# Patient Record
Sex: Female | Born: 1940
Health system: Southern US, Community
[De-identification: ages and names within clinical notes are randomized; demographics above are authoritative.]

## PROBLEM LIST (undated history)

## (undated) DIAGNOSIS — N6320 Unspecified lump in the left breast, unspecified quadrant: Secondary | ICD-10-CM

## (undated) DIAGNOSIS — K259 Gastric ulcer, unspecified as acute or chronic, without hemorrhage or perforation: Secondary | ICD-10-CM

## (undated) HISTORY — PX: TUBAL LIGATION: SHX77

## (undated) HISTORY — PX: CHOLECYSTECTOMY: SHX55

## (undated) HISTORY — PX: AUGMENTATION MAMMAPLASTY: SUR837

## (undated) HISTORY — DX: Unspecified lump in the left breast, unspecified quadrant: N63.20

## (undated) HISTORY — PX: OTHER SURGICAL HISTORY: SHX169

---

## 2003-11-08 ENCOUNTER — Emergency Department (HOSPITAL_COMMUNITY): Admission: EM | Admit: 2003-11-08 | Discharge: 2003-11-08 | Payer: Self-pay | Admitting: Emergency Medicine

## 2004-09-16 ENCOUNTER — Ambulatory Visit (HOSPITAL_COMMUNITY): Admission: RE | Admit: 2004-09-16 | Discharge: 2004-09-16 | Payer: Self-pay | Admitting: Obstetrics and Gynecology

## 2004-10-06 ENCOUNTER — Ambulatory Visit (HOSPITAL_COMMUNITY): Admission: RE | Admit: 2004-10-06 | Discharge: 2004-10-06 | Payer: Self-pay | Admitting: Obstetrics and Gynecology

## 2005-05-05 ENCOUNTER — Ambulatory Visit: Payer: Self-pay | Admitting: Orthopedic Surgery

## 2005-06-30 ENCOUNTER — Ambulatory Visit: Payer: Self-pay | Admitting: Orthopedic Surgery

## 2005-07-19 ENCOUNTER — Ambulatory Visit: Payer: Self-pay | Admitting: Orthopedic Surgery

## 2005-07-19 ENCOUNTER — Ambulatory Visit (HOSPITAL_COMMUNITY): Admission: RE | Admit: 2005-07-19 | Discharge: 2005-07-19 | Payer: Self-pay | Admitting: Orthopedic Surgery

## 2005-07-21 ENCOUNTER — Ambulatory Visit: Payer: Self-pay | Admitting: Orthopedic Surgery

## 2005-07-28 ENCOUNTER — Ambulatory Visit: Payer: Self-pay | Admitting: Orthopedic Surgery

## 2005-08-10 ENCOUNTER — Ambulatory Visit: Payer: Self-pay | Admitting: Orthopedic Surgery

## 2005-08-25 ENCOUNTER — Ambulatory Visit: Payer: Self-pay | Admitting: Orthopedic Surgery

## 2005-09-08 ENCOUNTER — Ambulatory Visit: Payer: Self-pay | Admitting: Orthopedic Surgery

## 2006-01-18 ENCOUNTER — Ambulatory Visit (HOSPITAL_COMMUNITY): Admission: RE | Admit: 2006-01-18 | Discharge: 2006-01-18 | Payer: Self-pay | Admitting: Obstetrics and Gynecology

## 2006-02-27 IMAGING — US US BREAST*L*
1 series · 6 of 6 positions shown · non-contrast
Comparison: none

UNILATERAL LEFT  DIAGNOSTIC MAMMOGRAM AND LEFT BREAST ULTRASOUND:

CLINICAL:  The patient returns after screening study on 09-16-04.
Spot compression views are performed of  the lower inner quadrant of the left breast, showing a
persistent small nodule in the medial aspect of the left breast.  No discrete mass is identified
along the inferior aspect of the left breast on spot compression views.
On physical exam, I am unable to palpate an abnormality in the medial portion of the left breast.
Ultrasound is performed, showing an area of probable apocrine metaplasia in the 9 o'clock position
10 cm from the nipple.  This correlates well with the nodule seen mammographically in the medial
aspect of the left breast.  Ultrasound is also performed of the entire lower portion of the left
breast, showing normal-appearing fibroglandular parenchyma with scattered ectatic ducts.  No masses
identified in this location.

[Series 1: unknown · 0.06mm/px · 6 of 6 slices shown]
[im 1/6]
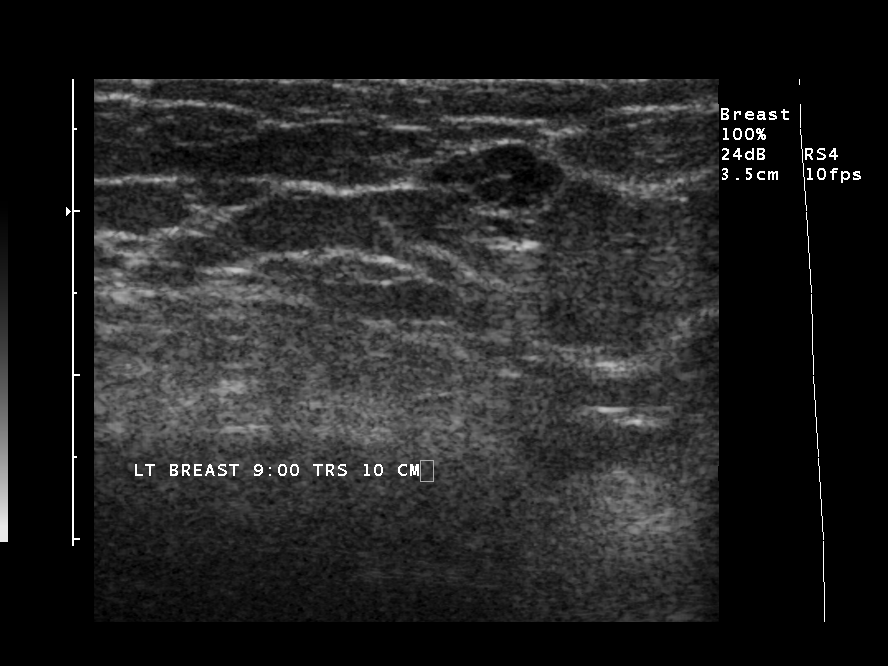
[im 2/6]
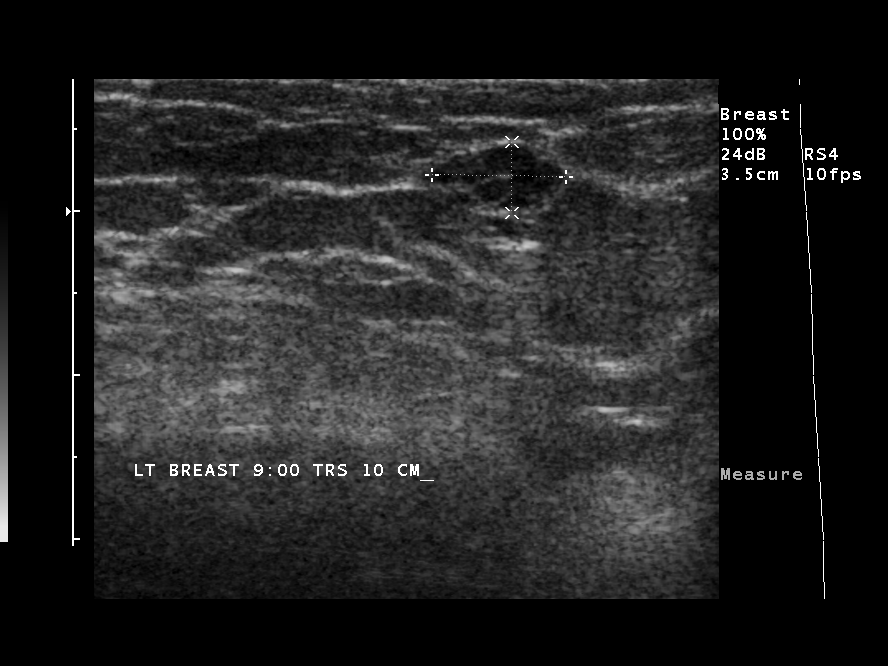
[im 3/6]
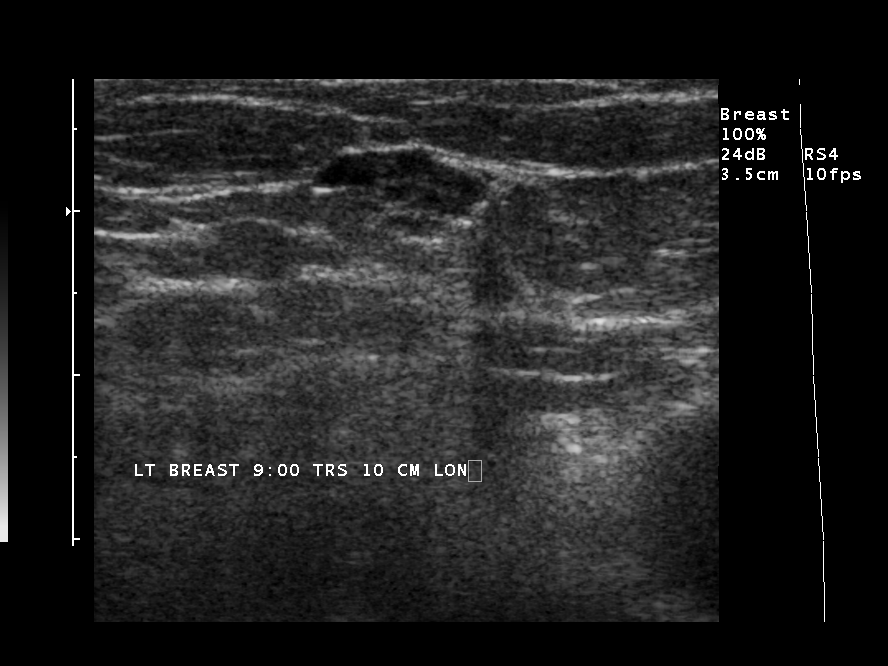
[im 4/6]
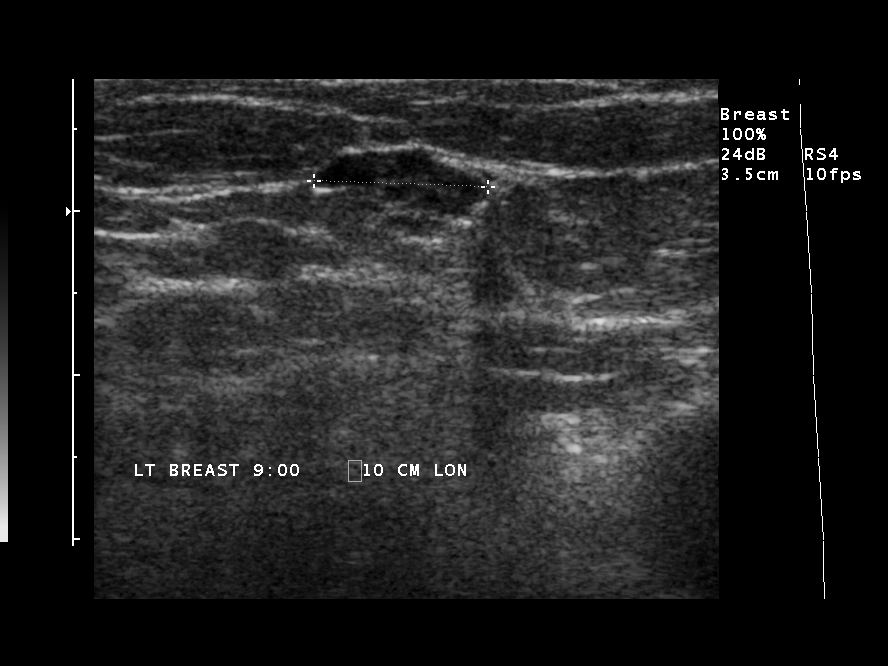
[im 5/6]
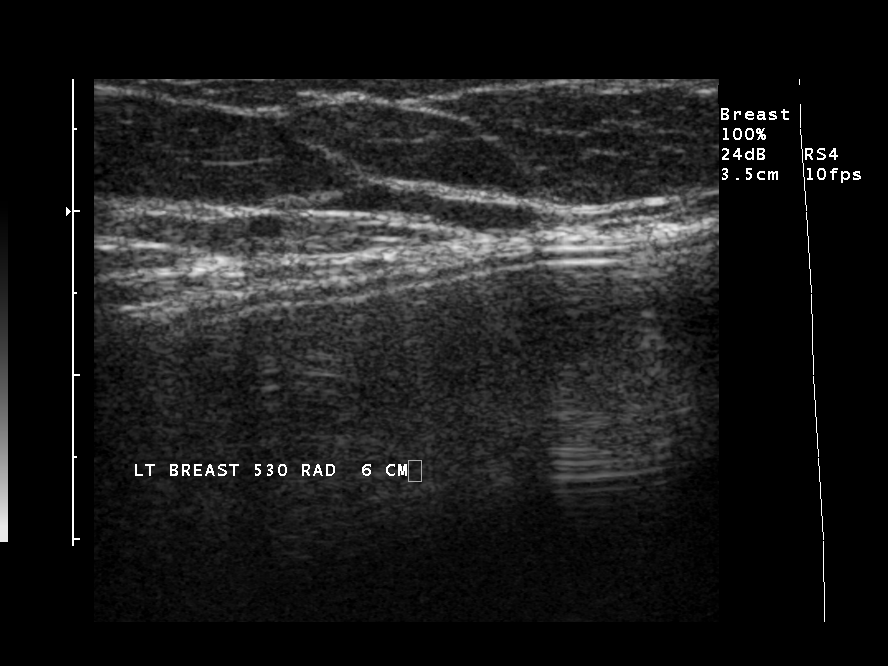
[im 6/6]
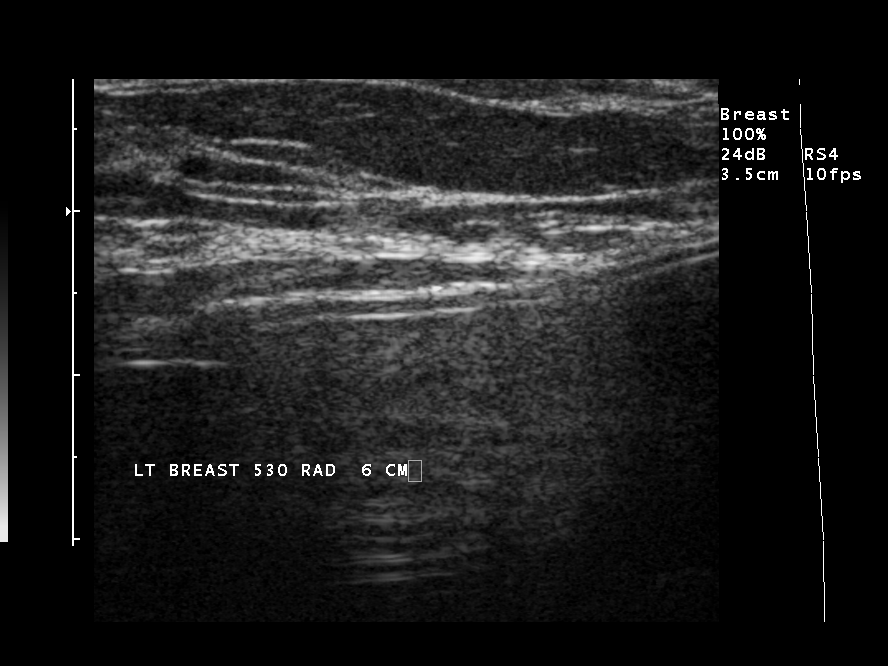

[6 of 6 positions shown; findings below may reference images not displayed]

IMPRESSION: Persistent abnormality in the medial portion of the left breast corresponds to an area of probable
apocrine metaplasia.  I would suggest a follow-up left breast ultrasound in six months.  I will
also compare with the prior films from [REDACTED] when they become available and an addendum will be made
regarding further recommendations.

Comparison is made to previous study from [REDACTED] dated 02/05/96. Recently noted nodule in the medial
aspect of the left breast is not clearly seen on prior study.  No other abnormality is noted on
prior exam.  I would concur with the suggestion for 6 month followup left breast ultrasound given
the probably benign sonographic findings.

ASSESSMENT: Probably benign - BI-RADS 3

Ultrasound of the left breast in 6 months.

## 2006-11-08 ENCOUNTER — Ambulatory Visit (HOSPITAL_COMMUNITY): Admission: RE | Admit: 2006-11-08 | Discharge: 2006-11-08 | Payer: Self-pay | Admitting: Internal Medicine

## 2006-11-08 ENCOUNTER — Ambulatory Visit: Payer: Self-pay | Admitting: Internal Medicine

## 2007-08-08 ENCOUNTER — Ambulatory Visit: Payer: Self-pay | Admitting: Gastroenterology

## 2007-08-22 ENCOUNTER — Ambulatory Visit (HOSPITAL_COMMUNITY): Admission: RE | Admit: 2007-08-22 | Discharge: 2007-08-22 | Payer: Self-pay | Admitting: Internal Medicine

## 2007-08-22 ENCOUNTER — Encounter: Payer: Self-pay | Admitting: Internal Medicine

## 2007-08-22 ENCOUNTER — Ambulatory Visit: Payer: Self-pay | Admitting: Internal Medicine

## 2008-04-09 ENCOUNTER — Other Ambulatory Visit: Admission: RE | Admit: 2008-04-09 | Discharge: 2008-04-09 | Payer: Self-pay | Admitting: Obstetrics and Gynecology

## 2008-11-13 ENCOUNTER — Encounter: Payer: Self-pay | Admitting: Orthopedic Surgery

## 2008-11-20 ENCOUNTER — Ambulatory Visit: Payer: Self-pay | Admitting: Orthopedic Surgery

## 2008-11-20 DIAGNOSIS — M79609 Pain in unspecified limb: Secondary | ICD-10-CM | POA: Insufficient documentation

## 2009-04-27 ENCOUNTER — Ambulatory Visit: Payer: Self-pay | Admitting: Orthopedic Surgery

## 2009-04-27 DIAGNOSIS — M76829 Posterior tibial tendinitis, unspecified leg: Secondary | ICD-10-CM | POA: Insufficient documentation

## 2009-10-22 ENCOUNTER — Ambulatory Visit (HOSPITAL_COMMUNITY): Admission: RE | Admit: 2009-10-22 | Discharge: 2009-10-22 | Payer: Self-pay | Admitting: Obstetrics & Gynecology

## 2009-10-29 ENCOUNTER — Other Ambulatory Visit: Admission: RE | Admit: 2009-10-29 | Discharge: 2009-10-29 | Payer: Self-pay | Admitting: Obstetrics and Gynecology

## 2010-11-21 ENCOUNTER — Encounter: Payer: Self-pay | Admitting: Obstetrics and Gynecology

## 2011-03-15 NOTE — H&P (Signed)
NAMEKEIRY, Wilson             ACCOUNT NO.:  [id number]   MEDICAL RECORD NO.:  [id number]          PATIENT TYPE:  AMB   LOCATION:  DAY                           FACILITY:  [facility]   PHYSICIAN:  [name], M.D.      DATE OF BIRTH:  May 30, 1941   DATE OF ADMISSION:  DATE OF DISCHARGE:  LH                              HISTORY & PHYSICAL   PATIENT NAME:  Misty Wilson.   MEDICAL RECORD NUMBER:  [id number].   DATE OF BIRTH:  [birth year].   DATE OF VISIT:  August 08, 2007.   CHIEF COMPLAINT:  Abdominal pain, history of ulcers.   PHYSICIAN COSIGNING NOTE:  [name], MD, in Dr. Suszanne Conners Rourk's  absence.   HISTORY OF PRESENT ILLNESS:  The patient is a 70 year old, Caucasian  female, a patient of Dr. Jena Gauss, who presents back for followup today.  She called to make the appointment stating that she has been having  abdominal pain.  She has a history of complicated peptic ulcer disease  with bleeding ulcers at least on two occasions requiring  hospitalization.  The last time was about 12 years ago.  She states she  was found to have H.pylori and was treated at that time.  She states she  has had ulcers on and off since age 76.  Three weeks ago, she started  having epigastric pain very similar to her symptoms of the past.  She  has pain almost constantly.  It does not seem to be worse or better with  or without meals.  She has been taking Advil two to three tablets a  night approximately three times a week for restless legs.  Her bowel  movements are regular.  No melena or rectal bleeding.  No nausea or  vomiting, dysphagia or odynophagia.   CURRENT MEDICATIONS:  Advil 400 to 600 mg q.h.s. p.r.n. generally about  three times a week.   ALLERGIES:  CODEINE.   PAST MEDICAL HISTORY:  As above, she has a history of complicated  bleeding peptic ulcer disease and H.pylori status post treatment.   PAST SURGICAL HISTORY:  1. Cholecystectomy, age 43.  2. Surgery on veins in the  71s.  3. Surgery on her toe two years ago.  4. Colonoscopy, November 08, 2006, revealed internal hemorrhoids and      left-sided diverticula.   FAMILY HISTORY:  She had a brother, who was being treated for colorectal  cancer.   SOCIAL HISTORY:  She is single, she has four children, she is employed  at [employer], she smokes about four cigarettes daily, she  consumes very little alcohol.   REVIEW OF SYSTEMS:  See HPI for GI.  CONSTITUTIONAL:  No weight loss.  CARDIOPULMONARY:  No chest pain, shortness of breath.   PHYSICAL EXAMINATION:  VITAL SIGNS:  Weight 173, height 5 foot 5, temp  98, blood pressure 128/78, pulse 64.  GENERAL:  A pleasant, well-nourished, Caucasian female in no acute  distress.  SKIN:  Warm and dry, no jaundice.  HEENT:  Sclerae are nonicteric, oropharyngeal mucosa moist and pink, no  lesions, erythema, or exudate, no lymphadenopathy or thyromegaly.  CHEST:  Lungs are clear to auscultation.  CARDIAC:  Regular rate and rhythm, normal S1 S2, no murmurs, rubs, or  gallops.  ABDOMEN:  The abdomen is soft, positive bowel sounds, nondistended.  She  has mild to moderate epigastric tenderness to deep palpation.  No  organomegaly or masses.  No rebound tenderness or guarding.  No  abdominal bruits or hernias.  EXTREMITIES:  No edema.   IMPRESSION:  This patient is a 70 year old lady with a three-week  history of epigastric pain.  History is significant for bleeding peptic  ulcer disease in the past.  She has been treated for Helicobacter  pylori.  She is taking frequent Advil.  Cannot exclude gastritis or  peptic ulcer disease at this point due to nonsteroidal antiinflammatory  drugs.   PLAN:  1. EGD with Dr. Jena Gauss in the near future.  I have asked her to limit      her Advil use as much as possible.  2. Nexium 40 mg daily, #20 samples provided.      Tana Coast, P.A.      [name], M.D.  Electronically Signed    LL/MEDQ  D:  08/08/2007   T:  08/08/2007  Job:  [id number]   cc:   Dorita Fray., M.D.  Fax: [phone]

## 2011-03-15 NOTE — Op Note (Signed)
[name], [name]             ACCOUNT NO.:  [id number]   MEDICAL RECORD NO.:  [id number]          PATIENT TYPE:  AMB   LOCATION:  DAY                           FACILITY:  [facility]   PHYSICIAN:  R. Roetta Sessions, M.D. DATE OF BIRTH:  04/24/1941   DATE OF PROCEDURE:  DATE OF DISCHARGE:                               OPERATIVE REPORT   INDICATIONS FOR PROCEDURE:  The patient a 70 year old lady with a 3-week  history of epigastric pain and prior history of complicated peptic ulcer  disease.  She was previously treated for H pylori.  She had been taking  quite a bit Advil recently.  She has backed off on them and has been  given Nexium recently which has been associated with marked improvement  in her symptoms.  EGD is now being done to further evaluate her  symptoms.  Approach has been discussed with the patient at length.  Potential risks, benefits and have been reviewed, questions answered.  Please see documentation of the medical record.   PROCEDURE NOTE:  O2 saturation, blood pressure, pulse, respirations were  monitored throughout the entire procedure.  Conscious sedation Versed 3  mg IV, Demerol 75 mg IV in divided doses.  __________ Pentax video chip  system.  Cetacaine spray for topical general anesthesia.   FINDINGS:  Examination of the tubular esophagus revealed a normal  mucosa.  EG junction easily traversed __________ stomach, colon, gastric  cavity was empty, insufflated well with __________ gastric mucosa  including retroflexion of the proximal stomach esophagogastric junction  revealing a small hiatal hernia and a couple of fundal polyps.  These  were small, less than 3 mm.  The gastric mucosa appeared normal.  There  is no ulcer, pylorus patent, easily traversed.  __________ second  portion revealed no abnormalities.   THERAPY/DIAGNOSTIC MANEUVERS:  Two gastric polyps for cold  biopsy/removed.  The patient tolerated the procedure well __________.   IMPRESSION:  Normal  esophagus, small hiatal hernia, fundal gland type  polyps, two, both resected with cold biopsy forceps, otherwise, normal  gastric mucosa patent pylorus, normal D1, D2.   RECOMMENDATIONS:  Continue Nexium 40 grams orally for the time being.  Likely, her dyspepsia is related to NSAID use.  I have recommend that  she minimize the use of Advil.  Wound not close the door on continued  p.r.n. therapy, but would take concomitant  __________ therapy in the  way of PPI.  It would be a reasonable approach.  The increased risk of  peptic ulcer disease in the setting of NSAID use have been discussed  with  Ms. Kargbo at length.  Will follow-up on path.  1. Further recommendations to follow.     Jonathon Bellows, M.D.  Electronically Signed    RMR/MEDQ  D:  08/22/2007  T:  08/23/2007  Job:  [id number]

## 2011-03-18 NOTE — H&P (Signed)
NAMEANAHLI, Misty Wilson             ACCOUNT NO.:  [id number]   MEDICAL RECORD NO.:  [id number]          PATIENT TYPE:  AMB   LOCATION:  DAY                           FACILITY:  [facility]   PHYSICIAN:  Vickki Hearing, M.D.DATE OF BIRTH:  [birth year]   DATE OF ADMISSION:  DATE OF DISCHARGE:  LH                                HISTORY & PHYSICAL   CHIEF COMPLAINT:  Pain along the lateral aspect of the left small toe.   HISTORY:  This is a 70 year old female who presents with pain over the  lateral aspect of her left small toe interfering with shoe wear.  It is  causing callus formation, and she presents for removal.   REVIEW OF SYSTEMS:  Reveals no major abnormalities of any of the 10 systems.   She does not tolerate CODEINE.   She has no major medical problems.  She has had a tubal ligation and  cholecystectomy.   FAMILY HISTORY:  Negative.   SOCIAL HISTORY:  She is single.  She is in Control and instrumentation engineer.  She does not  smoke.  Alcohol use is minimal. Caffeine use is minimal.  Highest grade  completed was grade 12.   PHYSICAL EXAMINATION:  VITAL SIGNS:  Weight approximately 170.  Pulse 80,  respiratory rate 18.  GENERAL:  Normally developed, well-groomed female, excellent hygiene.  Nutrition adequate, and body habitus is normal to medium in size.  PSYCHIATRIC:  She is alert and oriented x3.  Normal sensation, normal  reflexes, normal pulses.  No venous stasis.  Normal temperature.  SKIN:  Normal except for reddening, calloused area over the lateral aspect  of the left small toe.  MUSCULOSKELETAL:  Exam shows range of motion in the foot.  Metatarsophalangeal joint is normal.  Has good muscle tone, no instability  or synovitis.  There is tenderness over the prominent spur.   Radiograph was taken to document the spur.   DIAGNOSIS:  Spur, left small toe.   PLAN:  Surgical excision of spur, left small toe.      Vickki Hearing, M.D.  Electronically Signed     SEH/MEDQ  D:   07/18/2005  T:  07/18/2005  Job:  [id number]

## 2011-03-18 NOTE — Op Note (Signed)
[name], Misty Wilson             ACCOUNT NO.:  [id number]   MEDICAL RECORD NO.:  [id number]          PATIENT TYPE:  AMB   LOCATION:  DAY                           FACILITY:  [facility]   PHYSICIAN:  R. Roetta Sessions, M.D. DATE OF BIRTH:  12-29-1940   DATE OF PROCEDURE:  11/08/2006  DATE OF DISCHARGE:                               OPERATIVE REPORT   PROCEDURE:  Screening colonoscopy.   INDICATIONS FOR PROCEDURE:  The patient 70 year old lady devoid of any  lower GI tract symptoms.  There is a positive family history in her 33-  year-old brother who was just diagnosed with colorectal neoplasia, comes  for colorectal cancer screening.  She has sigmoidoscopy some 10-15 years  ago in [place] without significant findings.  She never had her  entire lower GI tract evaluated.  Colonoscopy is now being done.  This  approach has been discussed with the patient at length.  Potential  risks, benefits and was have been reviewed.  Please see documentation  medical record.   PROCEDURE NOTE:  O2 saturation, blood pressure, pulse and monitored the  entire procedure.   CONSCIOUS SEDATION:  Demerol 100 mg IV, Versed 5 mg IV.   INSTRUMENT:  Pentax video chip system.   FINDINGS:  Digital rectal exam revealed no abnormalities.  The prep was  adequate.  Examination of colonic mucosa from rectum to rectosigmoid  junction through the left, transverse, right colon to area of  appendiceal orifice, ileocecal valve and cecum was undertaken.  These  structures well seen and photographed for the record.  From this level  scope was withdrawn.  All previous mentioned mucosal surfaces were again  seen.  The patient had left-sided diverticula somewhat tortuous  redundant colon, otherwise colon mucosa appeared normal.  Scope was  pulled down into the rectum where thorough examination of the rectal  mucosa including retroflex view anal verge was performed.  The patient  was found to have some internal hemorrhoids,  otherwise rectal mucosa  appeared normal.  The patient tolerated the procedure well, was reacted  in endoscopy.   IMPRESSION:  Internal hemorrhoids, otherwise normal rectum, left-sided  diverticula, remainder colonic mucosa appeared normal.   RECOMMENDATIONS:  1. Diverticulosis literature provided to Misty Wilson.  2. Repeat high-risk screening colonoscopy 5 years.      Jonathon Bellows, M.D.  Electronically Signed     RMR/MEDQ  D:  11/08/2006  T:  11/08/2006  Job:  [id number]   cc:   Tilda Burrow, M.D.  Fax: [phone]

## 2011-03-18 NOTE — Op Note (Signed)
NAMELEVERN, KALKA             ACCOUNT NO.:  [id number]   MEDICAL RECORD NO.:  [id number]          PATIENT TYPE:  AMB   LOCATION:  DAY                           FACILITY:  [facility]   PHYSICIAN:  Vickki Hearing, M.D.DATE OF BIRTH:  [birth year]   DATE OF PROCEDURE:  07/19/2005  DATE OF DISCHARGE:                                 OPERATIVE REPORT   HISTORY:  This is a 70 year old female who presented with pain along the  lateral aspect of her left small toe interfering with shoe wear, causing  callus formation, and she presents for removal.   PREOPERATIVE DIAGNOSIS:  Spur, left small toe.   POSTOPERATIVE DIAGNOSIS:  Spur, left small toe.   PROCEDURE:  Excision of spur, left small toe.   SURGEON:  Dr. Romeo Apple, no assistants.   ANESTHESIA:  Was by MAC and local.   SPECIMENS:  There were no specimens.   ESTIMATED BLOOD LOSS:  Blood loss was zero.   TOURNIQUET TIME:  Tourniquet time was 14 minutes.   COMPLICATIONS:  There no complications. At the end of the procedure, the  patient went to PACU in good condition.   The patient was identified as [place]. Her left small toe was marked  as the surgical site, countersigned by the surgeon. She was given  antibiotics, 1 gram of Ancef, within 1 hour prior to skin incision. Her  history and physical was updated, and she was taken to the operating room  for monitored anesthesia. I additionally added a local block with 8 cc of  plain Marcaine. We then prepped and draped the extremity. We performed a  time-out, elevated the tourniquet, and then made an incision longitudinally  over the spur. We divided the subcu tissue down to the bone, did a  subperiosteal dissection, moving the extensor tendon towards the great toe.  We palpated the spur and then resected it, using a bur and rasp until a  smooth contour was noted. We then took a radiograph to confirm the removal,  irrigated the wound, closed in layered fashion with 3-0 Vicryl  and 4-0  Prolene. We then injected additional half percent Marcaine 2 cc and dressed  the wound sterilely, released the tourniquet. The patient was reversed from  the general anesthetic and then taken to the recovery room in stable  condition.   She can be weightbearing as tolerated. She will follow with me in two days  for dressing change. She will be placed in a Darco shoe. She was Dilaudid 2  mg 1 q.4h. p.r.n. for pain #40. She is ALLERGIC TO CODEINE.   We expect excellent recovery.      Vickki Hearing, M.D.  Electronically Signed     SEH/MEDQ  D:  07/19/2005  T:  07/19/2005  Job:  [id number]

## 2011-10-18 ENCOUNTER — Encounter: Payer: Self-pay | Admitting: Internal Medicine

## 2013-04-25 ENCOUNTER — Ambulatory Visit (INDEPENDENT_AMBULATORY_CARE_PROVIDER_SITE_OTHER): Payer: Medicare Other | Admitting: Adult Health

## 2013-04-25 ENCOUNTER — Encounter: Payer: Self-pay | Admitting: Adult Health

## 2013-04-25 ENCOUNTER — Other Ambulatory Visit (HOSPITAL_COMMUNITY)
Admission: RE | Admit: 2013-04-25 | Discharge: 2013-04-25 | Disposition: A | Discharge status: Home / Self Care | Payer: Medicare Other | Source: Ambulatory Visit | Attending: Adult Health | Admitting: Adult Health

## 2013-04-25 VITALS — BP 122/76 | HR 76 | Ht 60.0 in | Wt 169.0 lb

## 2013-04-25 DIAGNOSIS — Z01419 Encounter for gynecological examination (general) (routine) without abnormal findings: Secondary | ICD-10-CM

## 2013-04-25 DIAGNOSIS — N6320 Unspecified lump in the left breast, unspecified quadrant: Secondary | ICD-10-CM | POA: Insufficient documentation

## 2013-04-25 DIAGNOSIS — E785 Hyperlipidemia, unspecified: Secondary | ICD-10-CM

## 2013-04-25 DIAGNOSIS — Z1212 Encounter for screening for malignant neoplasm of rectum: Secondary | ICD-10-CM

## 2013-04-25 DIAGNOSIS — Z124 Encounter for screening for malignant neoplasm of cervix: Secondary | ICD-10-CM

## 2013-04-25 DIAGNOSIS — I10 Essential (primary) hypertension: Secondary | ICD-10-CM

## 2013-04-25 DIAGNOSIS — Z1151 Encounter for screening for human papillomavirus (HPV): Secondary | ICD-10-CM | POA: Insufficient documentation

## 2013-04-25 DIAGNOSIS — N63 Unspecified lump in unspecified breast: Secondary | ICD-10-CM

## 2013-04-25 HISTORY — DX: Unspecified lump in the left breast, unspecified quadrant: N63.20

## 2013-04-25 LAB — LIPID PANEL
Cholesterol: 190 mg/dL (ref 0–200)
HDL: 38 mg/dL — ABNORMAL LOW (ref >39)
LDL Cholesterol: 134 mg/dL — ABNORMAL HIGH (ref 0–99)
Total CHOL/HDL Ratio: 5 Ratio
Triglycerides: 89 mg/dL (ref <150)
VLDL: 18 mg/dL (ref 0–40)

## 2013-04-25 LAB — COMPREHENSIVE METABOLIC PANEL
ALT: 13 U/L (ref 0–35)
AST: 14 U/L (ref 0–37)
Albumin: 4.1 g/dL (ref 3.5–5.2)
Alkaline Phosphatase: 54 U/L (ref 39–117)
BUN: 15 mg/dL (ref 6–23)
CO2: 30 mEq/L (ref 19–32)
Calcium: 9.6 mg/dL (ref 8.4–10.5)
Chloride: 104 mEq/L (ref 96–112)
Creat: 0.67 mg/dL (ref 0.50–1.10)
Glucose, Bld: 79 mg/dL (ref 70–99)
Potassium: 4.5 mEq/L (ref 3.5–5.3)
Sodium: 139 mEq/L (ref 135–145)
Total Bilirubin: 0.4 mg/dL (ref 0.3–1.2)
Total Protein: 6.6 g/dL (ref 6.0–8.3)

## 2013-04-25 LAB — HEMOCCULT GUIAC POC 1CARD (OFFICE): Fecal Occult Blood, POC: NEGATIVE

## 2013-04-25 LAB — CBC
HCT: 37.4 % (ref 36.0–46.0)
Hemoglobin: 12.6 g/dL (ref 12.0–15.0)
MCH: 29.2 pg (ref 26.0–34.0)
MCHC: 33.7 g/dL (ref 30.0–36.0)
MCV: 86.8 fL (ref 78.0–100.0)
Platelets: 258 10*3/uL (ref 150–400)
RBC: 4.31 MIL/uL (ref 3.87–5.11)
RDW: 14 % (ref 11.5–15.5)
WBC: 6.6 10*3/uL (ref 4.0–10.5)

## 2013-04-25 LAB — TSH: TSH: 2.011 u[IU]/mL (ref 0.350–4.500)

## 2013-04-25 NOTE — Patient Instructions (Addendum)
Physical in 1 year Mammogram 7/2 at 3 pm be there at 2:45 pm Get mammogram Follow labs by phone or my chart

## 2013-04-25 NOTE — Progress Notes (Signed)
Patient ID: Misty Wilson, female   DOB: 05/21/1941, 72 y.o.   MRN: [id number] History of Present Illness: Misty Wilson is a 72 year old white female in for a pap and physical.   Current Medications, Allergies, Past Medical History, Past Surgical History, Family History and Social History were reviewed in [employer] electronic medical record.    Review of Systems: Patient denies any headaches, blurred vision, shortness of breath, chest pain, abdominal pain, problems with bowel movements, urination, or intercourse. No joint pains or mood swings   Physical Exam:BP 122/76  Pulse 76  Ht 5' (1.524 m)  Wt 169 lb (76.658 kg)  BMI 33.01 kg/m2 General:  Well developed, well nourished, no acute distress Skin:  Warm and dry,tan Neck:  Midline trachea, normal thyroid, no carotid bruits heard Lungs; Clear to auscultation bilaterally Breast:  No dominant palpable mass, retraction, or nipple discharge on right, she does have implant, on the left, no retraction or nipple discharge there is a mass at 2 o'clock 13 cm from nipple at the edge of the implant Cardiovascular: Regular rate and rhythm Abdomen:  Soft, non tender, no hepatosplenomegaly Pelvic:  External genitalia is normal in appearance for age.  The vagina has decrease color and rugae, fair moisutre. The cervix is bulbous.Pap performed with HPV.  Uterus is felt to be normal size, shape, and contour.  No  adnexal masses or tenderness noted. Rectal: Good sphincter tone, no polyps, or hemorrhoids felt.  Hemoccult negative.Mild rectocele Extremities:  No swelling  noted Psych:  Alert and cooperative, seems happy, works 6 days per week  Impression: Yearly gyn exam Left breast mass   Plan: Physical in 1 year  Check CBC,CMP,TSH, and lipids Get colonoscopy as per Dr Jena Gauss and its due Diagnostic mammogram and left [place] scheduled for 05/01/13 at 3 pm at [facility]

## 2013-04-25 NOTE — Assessment & Plan Note (Signed)
Has implants has mass left breast at 2 oclock 13 cm from nipple at edge of implant will get dx mammogram and left [place] 7/2 at 3 pm at [facility]

## 2013-04-25 NOTE — Addendum Note (Signed)
Addended by: Richardson Chiquito on: 04/25/2013 10:49 AM   Modules accepted: Orders

## 2013-04-26 ENCOUNTER — Telehealth: Payer: Self-pay | Admitting: Adult Health

## 2013-04-26 NOTE — Telephone Encounter (Signed)
Left message about labs

## 2013-05-01 ENCOUNTER — Other Ambulatory Visit: Payer: Self-pay | Admitting: Adult Health

## 2013-05-01 ENCOUNTER — Ambulatory Visit (HOSPITAL_COMMUNITY)
Admission: RE | Admit: 2013-05-01 | Discharge: 2013-05-01 | Disposition: A | Discharge status: Home / Self Care | Payer: Medicare Other | Source: Ambulatory Visit | Attending: Adult Health | Admitting: Adult Health

## 2013-05-01 DIAGNOSIS — N63 Unspecified lump in unspecified breast: Secondary | ICD-10-CM | POA: Insufficient documentation

## 2013-05-01 DIAGNOSIS — I10 Essential (primary) hypertension: Secondary | ICD-10-CM

## 2013-05-01 DIAGNOSIS — Z124 Encounter for screening for malignant neoplasm of cervix: Secondary | ICD-10-CM

## 2013-05-01 DIAGNOSIS — N6320 Unspecified lump in the left breast, unspecified quadrant: Secondary | ICD-10-CM

## 2013-05-01 DIAGNOSIS — Z01419 Encounter for gynecological examination (general) (routine) without abnormal findings: Secondary | ICD-10-CM

## 2013-05-01 DIAGNOSIS — E785 Hyperlipidemia, unspecified: Secondary | ICD-10-CM

## 2013-06-05 ENCOUNTER — Other Ambulatory Visit: Payer: Self-pay

## 2013-09-05 ENCOUNTER — Other Ambulatory Visit: Payer: Self-pay

## 2014-09-01 ENCOUNTER — Encounter: Payer: Self-pay | Admitting: Adult Health

## 2015-03-06 ENCOUNTER — Ambulatory Visit (INDEPENDENT_AMBULATORY_CARE_PROVIDER_SITE_OTHER): Payer: Medicare Other | Admitting: Adult Health

## 2015-03-06 ENCOUNTER — Telehealth: Payer: Self-pay | Admitting: General Practice

## 2015-03-06 ENCOUNTER — Encounter: Payer: Self-pay | Admitting: Adult Health

## 2015-03-06 VITALS — BP 142/78 | HR 64 | Ht 64.0 in | Wt 173.0 lb

## 2015-03-06 DIAGNOSIS — Z01419 Encounter for gynecological examination (general) (routine) without abnormal findings: Secondary | ICD-10-CM

## 2015-03-06 DIAGNOSIS — Z1212 Encounter for screening for malignant neoplasm of rectum: Secondary | ICD-10-CM | POA: Diagnosis not present

## 2015-03-06 LAB — HEMOCCULT GUIAC POC 1CARD (OFFICE): Fecal Occult Blood, POC: NEGATIVE

## 2015-03-06 NOTE — Progress Notes (Signed)
Patient ID: Misty Wilson, female   DOB: [birth year], 74 y.o.   MRN: [id number] History of Present Illness:  Misty Wilson is a 74 year old white female, that looks and acts younger than her stated age, in for a well woman gyn exam.She had a normal pap with negative HPV 04/25/13.  Current Medications, Allergies, Past Medical History, Past Surgical History, Family History and Social History were reviewed in [employer] record.     Review of Systems: Patient denies any headaches, hearing loss, fatigue, blurred vision, shortness of breath, chest pain, abdominal pain, problems with bowel movements, urination, or intercourse. No joint pain or mood swings.    Physical Exam:BP 142/78 mmHg  Pulse 64  Ht 5\' 4"  (1.626 m)  Wt 173 lb (78.472 kg)  BMI 29.68 kg/m2 General:  Well developed, well nourished, no acute distress Skin:  Warm and dry Neck:  Midline trachea, normal thyroid, good ROM, no lymphadenopathy, no carotid bruits heard Lungs; Clear to auscultation bilaterally Breast:  No dominant palpable mass, retraction, or nipple discharge, she has bilateral subpectoral implants  Cardiovascular: Regular rate and rhythm Abdomen:  Soft, non tender, no hepatosplenomegaly Pelvic:  External genitalia is normal in appearance, no lesions.  The vagina has decreased color, moisture and rugae, has some relaxation. Urethra has no lesions or masses. The cervix is atrophic.  Uterus is felt to be normal size, shape, and contour.  No adnexal masses or tenderness noted.Bladder is non tender, no masses felt. Rectal: Good sphincter tone, no polyps, or hemorrhoids felt.  Hemoccult negative. Extremities/musculoskeletal:  No swelling or varicosities noted, no clubbing or cyanosis Psych:  No mood changes, alert and cooperative,seems happy Had normal DEXA per pt years ago.  Impression: Well woman gyn exam no pap    Plan: Check CBC,CMP,TSH and lipids Pap and physical in 1 year Mammogram now and  yearly Colonoscopy in near future, she will schedule

## 2015-03-06 NOTE — Telephone Encounter (Signed)
Patient would like to be triaged for her repeat tcs.  She had it done back in 2008 and RMR recommended a repeat in 5 years because she is high risk.  She was mailed a letter however she stated she had been putting it off.  She is ready to schedule her tcs and can be reached at [phone].  Patient states she is not having any problems.   Routing to [employer].

## 2015-03-06 NOTE — Patient Instructions (Signed)
Pap and physical in 2 years Mammogram yearly Colonoscopy advised

## 2015-03-07 LAB — LIPID PANEL
Chol/HDL Ratio: 5 ratio units — ABNORMAL HIGH (ref 0.0–4.4)
Cholesterol, Total: 216 mg/dL — ABNORMAL HIGH (ref 100–199)
HDL: 43 mg/dL (ref >39)
LDL Calculated: 153 mg/dL — ABNORMAL HIGH (ref 0–99)
Triglycerides: 101 mg/dL (ref 0–149)
VLDL Cholesterol Cal: 20 mg/dL (ref 5–40)

## 2015-03-07 LAB — COMPREHENSIVE METABOLIC PANEL
ALT: 18 IU/L (ref 0–32)
AST: 20 IU/L (ref 0–40)
Albumin/Globulin Ratio: 2 (ref 1.1–2.5)
Albumin: 4.5 g/dL (ref 3.5–4.8)
Alkaline Phosphatase: 68 IU/L (ref 39–117)
BUN/Creatinine Ratio: 20 (ref 11–26)
BUN: 13 mg/dL (ref 8–27)
Bilirubin Total: 0.3 mg/dL (ref 0.0–1.2)
CO2: 24 mmol/L (ref 18–29)
Calcium: 9.8 mg/dL (ref 8.7–10.3)
Chloride: 102 mmol/L (ref 97–108)
Creatinine, Ser: 0.66 mg/dL (ref 0.57–1.00)
GFR calc Af Amer: 102 mL/min/{1.73_m2} (ref >59)
GFR calc non Af Amer: 89 mL/min/{1.73_m2} (ref >59)
Globulin, Total: 2.2 g/dL (ref 1.5–4.5)
Glucose: 93 mg/dL (ref 65–99)
Potassium: 4.6 mmol/L (ref 3.5–5.2)
Sodium: 141 mmol/L (ref 134–144)
Total Protein: 6.7 g/dL (ref 6.0–8.5)

## 2015-03-07 LAB — CBC
Hematocrit: 39.1 % (ref 34.0–46.6)
Hemoglobin: 13.3 g/dL (ref 11.1–15.9)
MCH: 29.9 pg (ref 26.6–33.0)
MCHC: 34 g/dL (ref 31.5–35.7)
MCV: 88 fL (ref 79–97)
Platelets: 279 10*3/uL (ref 150–379)
RBC: 4.45 x10E6/uL (ref 3.77–5.28)
RDW: 12.8 % (ref 12.3–15.4)
WBC: 6.6 10*3/uL (ref 3.4–10.8)

## 2015-03-07 LAB — TSH: TSH: 2.51 u[IU]/mL (ref 0.450–4.500)

## 2015-03-09 ENCOUNTER — Telehealth: Payer: Self-pay | Admitting: Adult Health

## 2015-03-09 NOTE — Telephone Encounter (Signed)
Left message that labs looked good except for lipid panel needs to modify diet and increase exercise and recheck in 3-6 months or can go ahead and add statin,call me backPt aware of labs, and need to modify diet and increase exercise, she can't take statins and declines meds

## 2015-03-10 ENCOUNTER — Other Ambulatory Visit: Payer: Self-pay

## 2015-03-10 ENCOUNTER — Telehealth: Payer: Self-pay

## 2015-03-10 ENCOUNTER — Telehealth: Payer: Self-pay | Admitting: *Deleted

## 2015-03-10 DIAGNOSIS — Z1211 Encounter for screening for malignant neoplasm of colon: Secondary | ICD-10-CM

## 2015-03-10 DIAGNOSIS — Z800 Family history of malignant neoplasm of digestive organs: Secondary | ICD-10-CM

## 2015-03-10 NOTE — Telephone Encounter (Signed)
Patient called back to check on the status of scheduling her tcs.  I told her that Misty Wilson works on [place] and she will call her to get her TCS scheduled.

## 2015-03-10 NOTE — Telephone Encounter (Signed)
Will do diet and exercise first, after discussing labs

## 2015-03-11 NOTE — Telephone Encounter (Signed)
Pt is scheduled for 04/06/2015 at 8:30 Am with Dr. Gala Romney.

## 2015-03-19 NOTE — Telephone Encounter (Signed)
Gastroenterology Pre-Procedure Review  Request Date: 03/10/2015 Requesting Physician: Pt past due/ last colonoscopy was 11/08/2006 by Dr. Gala Romney family hs of colon cancer  PATIENT REVIEW QUESTIONS: The patient responded to the following health history questions as indicated:    1. Diabetes Melitis: no 2. Joint replacements in the past 12 months: no 3. Major health problems in the past 3 months: no 4. Has an artificial valve or MVP: no 5. Has a defibrillator: no 6. Has been advised in past to take antibiotics in advance of a procedure like teeth cleaning: no    MEDICATIONS & ALLERGIES:    Patient reports the following regarding taking any blood thinners:   Plavix? no Aspirin? no Coumadin? no  Patient confirms/reports the following medications:  No current outpatient prescriptions on file.   No current facility-administered medications for this visit.    Patient confirms/reports the following allergies:  Allergies  Allergen Reactions  . Codeine     "makes me really, really sick"     No orders of the defined types were placed in this encounter.    AUTHORIZATION INFORMATION Primary Insurance:  ID #:  Group #:  Pre-Cert / Auth required:  Pre-Cert / Auth #:   Secondary Insurance: ID #:   Group #:  Pre-Cert / Auth required:  Pre-Cert / Auth #:   SCHEDULE INFORMATION: Procedure has been scheduled as follows:  Date:   04/06/2015          Time:  8:30 AM Location: [facility] Short Stay  This Gastroenterology Pre-Precedure Review Form is being routed to the following provider(s): R. Garfield Cornea, MD

## 2015-03-20 MED ORDER — PEG-KCL-NACL-NASULF-NA ASC-C 100 G PO SOLR: 1.0000 | ORAL | Status: DC

## 2015-03-20 NOTE — Telephone Encounter (Signed)
Rx sent to the pharmacy and instructions mailed to pt.  

## 2015-03-20 NOTE — Telephone Encounter (Signed)
Appropriate.

## 2015-03-25 ENCOUNTER — Other Ambulatory Visit: Payer: Self-pay

## 2015-04-01 ENCOUNTER — Telehealth: Payer: Self-pay

## 2015-04-01 NOTE — Telephone Encounter (Signed)
I called [employer] @ [phone] and spoke to Mon[place] J who said that a PA is not required for a screening colonoscopy.

## 2015-04-06 ENCOUNTER — Ambulatory Visit (HOSPITAL_COMMUNITY)
Admission: RE | Admit: 2015-04-06 | Discharge: 2015-04-06 | Disposition: A | Discharge status: Home / Self Care | Payer: Medicare Other | Source: Ambulatory Visit | Attending: Internal Medicine | Admitting: Internal Medicine

## 2015-04-06 ENCOUNTER — Encounter (HOSPITAL_COMMUNITY)
Admission: RE | Disposition: A | Discharge status: Home / Self Care | Payer: Self-pay | Source: Ambulatory Visit | Attending: Internal Medicine

## 2015-04-06 DIAGNOSIS — F17210 Nicotine dependence, cigarettes, uncomplicated: Secondary | ICD-10-CM | POA: Insufficient documentation

## 2015-04-06 DIAGNOSIS — Z791 Long term (current) use of non-steroidal anti-inflammatories (NSAID): Secondary | ICD-10-CM | POA: Diagnosis not present

## 2015-04-06 DIAGNOSIS — Z860100 Personal history of colon polyps, unspecified: Secondary | ICD-10-CM | POA: Insufficient documentation

## 2015-04-06 DIAGNOSIS — D128 Benign neoplasm of rectum: Secondary | ICD-10-CM | POA: Insufficient documentation

## 2015-04-06 DIAGNOSIS — Z800 Family history of malignant neoplasm of digestive organs: Secondary | ICD-10-CM | POA: Diagnosis not present

## 2015-04-06 DIAGNOSIS — K621 Rectal polyp: Secondary | ICD-10-CM | POA: Diagnosis not present

## 2015-04-06 DIAGNOSIS — Z79899 Other long term (current) drug therapy: Secondary | ICD-10-CM | POA: Diagnosis not present

## 2015-04-06 DIAGNOSIS — K5730 Diverticulosis of large intestine without perforation or abscess without bleeding: Secondary | ICD-10-CM | POA: Insufficient documentation

## 2015-04-06 DIAGNOSIS — Z1211 Encounter for screening for malignant neoplasm of colon: Secondary | ICD-10-CM | POA: Diagnosis not present

## 2015-04-06 HISTORY — PX: COLONOSCOPY: SHX5424

## 2015-04-06 SURGERY — COLONOSCOPY
Anesthesia: Moderate Sedation

## 2015-04-06 MED ORDER — MEPERIDINE HCL 100 MG/ML IJ SOLN
INTRAMUSCULAR | Status: DC | PRN
  Administered 2015-04-06: 25 mg
  Administered 2015-04-06: 50 mg
  Administered 2015-04-06: 25 mg

## 2015-04-06 MED ORDER — ONDANSETRON HCL 4 MG/2ML IJ SOLN
INTRAMUSCULAR | Status: DC | PRN
  Administered 2015-04-06: 4 mg via INTRAVENOUS

## 2015-04-06 MED ORDER — MIDAZOLAM HCL 5 MG/5ML IJ SOLN
INTRAMUSCULAR | Status: AC
  Filled 2015-04-06: qty 10

## 2015-04-06 MED ORDER — SODIUM CHLORIDE 0.9 % IV SOLN: INTRAVENOUS | Status: DC

## 2015-04-06 MED ORDER — ONDANSETRON HCL 4 MG/2ML IJ SOLN
INTRAMUSCULAR | Status: AC
  Filled 2015-04-06: qty 2

## 2015-04-06 MED ORDER — MEPERIDINE HCL 100 MG/ML IJ SOLN
INTRAMUSCULAR | Status: AC
  Filled 2015-04-06: qty 2

## 2015-04-06 MED ORDER — SIMETHICONE 40 MG/0.6ML PO SUSP
ORAL | Status: DC | PRN
  Administered 2015-04-06: 09:00:00

## 2015-04-06 MED ORDER — MIDAZOLAM HCL 5 MG/5ML IJ SOLN
INTRAMUSCULAR | Status: DC | PRN
  Administered 2015-04-06: 2 mg via INTRAVENOUS
  Administered 2015-04-06: 1 mg via INTRAVENOUS
  Administered 2015-04-06: 2 mg via INTRAVENOUS

## 2015-04-06 NOTE — Discharge Instructions (Signed)
Colonoscopy Discharge Instructions  Read the instructions outlined below and refer to this sheet in the next few weeks. These discharge instructions provide you with general information on caring for yourself after you leave the hospital. Your doctor may also give you specific instructions. While your treatment has been planned according to the most current medical practices available, unavoidable complications occasionally occur. If you have any problems or questions after discharge, call Dr. Gala Romney at [phone]. ACTIVITY  You may resume your regular activity, but move at a slower pace for the next 24 hours.   Take frequent rest periods for the next 24 hours.   Walking will help get rid of the air and reduce the bloated feeling in your belly (abdomen).   No driving for 24 hours (because of the medicine (anesthesia) used during the test).    Do not sign any important legal documents or operate any machinery for 24 hours (because of the anesthesia used during the test).  NUTRITION  Drink plenty of fluids.   You may resume your normal diet as instructed by your doctor.   Begin with a light meal and progress to your normal diet. Heavy or fried foods are harder to digest and may make you feel sick to your stomach (nauseated).   Avoid alcoholic beverages for 24 hours or as instructed.  MEDICATIONS  You may resume your normal medications unless your doctor tells you otherwise.  WHAT YOU CAN EXPECT TODAY  Some feelings of bloating in the abdomen.   Passage of more gas than usual.   Spotting of blood in your stool or on the toilet paper.  IF YOU HAD POLYPS REMOVED DURING THE COLONOSCOPY:  No aspirin products for 7 days or as instructed.   No alcohol for 7 days or as instructed.   Eat a soft diet for the next 24 hours.  FINDING OUT THE RESULTS OF YOUR TEST Not all test results are available during your visit. If your test results are not back during the visit, make an appointment  with your caregiver to find out the results. Do not assume everything is normal if you have not heard from your caregiver or the medical facility. It is important for you to follow up on all of your test results.  SEEK IMMEDIATE MEDICAL ATTENTION IF:  You have more than a spotting of blood in your stool.   Your belly is swollen (abdominal distention).   You are nauseated or vomiting.   You have a temperature over 101.   You have abdominal pain or discomfort that is severe or gets worse throughout the day.   Diverticulitis Diverticulitis is inflammation or infection of small pouches in your colon that form when you have a condition called diverticulosis. The pouches in your colon are called diverticula. Your colon, or large intestine, is where water is absorbed and stool is formed. Complications of diverticulitis can include:  Bleeding.  Severe infection.  Severe pain.  Perforation of your colon.  Obstruction of your colon. CAUSES  Diverticulitis is caused by bacteria. Diverticulitis happens when stool becomes trapped in diverticula. This allows bacteria to grow in the diverticula, which can lead to inflammation and infection. RISK FACTORS People with diverticulosis are at risk for diverticulitis. Eating a diet that does not include enough fiber from fruits and vegetables may make diverticulitis more likely to develop. SYMPTOMS  Symptoms of diverticulitis may include:  Abdominal pain and tenderness. The pain is normally located on the left side of the abdomen, but  may occur in other areas.  Fever and chills.  Bloating.  Cramping.  Nausea.  Vomiting.  Constipation.  Diarrhea.  Blood in your stool. DIAGNOSIS  Your health care provider will ask you about your medical history and do a physical exam. You may need to have tests done because many medical conditions can cause the same symptoms as diverticulitis. Tests may include:  Blood tests.  Urine tests.  Imaging  tests of the abdomen, including X-rays and CT scans. When your condition is under control, your health care provider may recommend that you have a colonoscopy. A colonoscopy can show how severe your diverticula are and whether something else is causing your symptoms. TREATMENT  Most cases of diverticulitis are mild and can be treated at home. Treatment may include:  Taking over-the-counter pain medicines.  Following a clear liquid diet.  Taking antibiotic medicines by mouth for 7-10 days. More severe cases may be treated at a hospital. Treatment may include:  Not eating or drinking.  Taking prescription pain medicine.  Receiving antibiotic medicines through an IV tube.  Receiving fluids and nutrition through an IV tube.  Surgery. HOME CARE INSTRUCTIONS   Follow your health care provider's instructions carefully.  Follow a full liquid diet or other diet as directed by your health care provider. After your symptoms improve, your health care provider may tell you to change your diet. He or she may recommend you eat a high-fiber diet. Fruits and vegetables are good sources of fiber. Fiber makes it easier to pass stool.  Take fiber supplements or probiotics as directed by your health care provider.  Only take medicines as directed by your health care provider.  Keep all your follow-up appointments. SEEK MEDICAL CARE IF:   Your pain does not improve.  You have a hard time eating food.  Your bowel movements do not return to normal. SEEK IMMEDIATE MEDICAL CARE IF:   Your pain becomes worse.  Your symptoms do not get better.  Your symptoms suddenly get worse.  You have a fever.  You have repeated vomiting.  You have bloody or black, tarry stools. MAKE SURE YOU:   Understand these instructions.  Will watch your condition.  Will get help right away if you are not doing well or get worse. Document Released: 07/27/2005 Document Revised: 10/22/2013 Document Reviewed:  09/11/2013 [facility] Patient Information 2015 [place], [place]. This information is not intended to replace advice given to you by your health care provider. Make sure you discuss any questions you have with your health care provider.  Colon Polyps Polyps are lumps of extra tissue growing inside the body. Polyps can grow in the large intestine (colon). Most colon polyps are noncancerous (benign). However, some colon polyps can become cancerous over time. Polyps that are larger than a pea may be harmful. To be safe, caregivers remove and test all polyps. CAUSES  Polyps form when mutations in the genes cause your cells to grow and divide even though no more tissue is needed. RISK FACTORS There are a number of risk factors that can increase your chances of getting colon polyps. They include:  Being older than 50 years.  Family history of colon polyps or colon cancer.  Long-term colon diseases, such as colitis or Crohn disease.  Being overweight.  Smoking.  Being inactive.  Drinking too much alcohol. SYMPTOMS  Most small polyps do not cause symptoms. If symptoms are present, they may include:  Blood in the stool. The stool may look dark red or  black.  Constipation or diarrhea that lasts longer than 1 week. DIAGNOSIS People often do not know they have polyps until their caregiver finds them during a regular checkup. Your caregiver can use 4 tests to check for polyps:  Digital rectal exam. The caregiver wears gloves and feels inside the rectum. This test would find polyps only in the rectum.  Barium enema. The caregiver puts a liquid called barium into your rectum before taking X-rays of your colon. Barium makes your colon look white. Polyps are dark, so they are easy to see in the X-ray pictures.  Sigmoidoscopy. A thin, flexible tube (sigmoidoscope) is placed into your rectum. The sigmoidoscope has a light and tiny camera in it. The caregiver uses the sigmoidoscope to look at the last  third of your colon.  Colonoscopy. This test is like sigmoidoscopy, but the caregiver looks at the entire colon. This is the most common method for finding and removing polyps. TREATMENT  Any polyps will be removed during a sigmoidoscopy or colonoscopy. The polyps are then tested for cancer. PREVENTION  To help lower your risk of getting more colon polyps:  Eat plenty of fruits and vegetables. Avoid eating fatty foods.  Do not smoke.  Avoid drinking alcohol.  Exercise every day.  Lose weight if recommended by your caregiver.  Eat plenty of calcium and folate. Foods that are rich in calcium include milk, cheese, and broccoli. Foods that are rich in folate include chickpeas, kidney beans, and spinach. HOME CARE INSTRUCTIONS Keep all follow-up appointments as directed by your caregiver. You may need periodic exams to check for polyps. SEEK MEDICAL CARE IF: You notice bleeding during a bowel movement. Document Released: 07/13/2004 Document Revised: 01/09/2012 Document Reviewed: 12/27/2011 [facility] Patient Information 2015 [place], [place]. This information is not intended to replace advice given to you by your health care provider. Make sure you discuss any questions you have with your health care provider.   Polyp and diverticulosis information provided  Further recommendations to follow pending review of pathology report

## 2015-04-06 NOTE — H&P (Signed)
'@LOGO' @   Primary Care Physician:  Pcp Not In System Primary Gastroenterologist:  Dr. Gala Romney  Pre-Procedure History & Physical: HPI:  Misty Wilson is a 74 y.o. female is here for a screening colonoscopy. Negative colonoscopy 2008. Family history significant in that both brother and mother had colon cancer. No bowel symptoms.  Past Medical History  Diagnosis Date  . Breast mass, left 04/25/2013    Past Surgical History  Procedure Laterality Date  . Cholecystectomy    . Veins      had varicose veins done  . Tubal ligation      Prior to Admission medications   Medication Sig Start Date End Date Taking? Authorizing Provider  B Complex-C (B-COMPLEX WITH VITAMIN C) tablet Take 1 tablet by mouth daily.   Yes Historical Provider, MD  calcium carbonate (OS-CAL - DOSED IN MG OF ELEMENTAL CALCIUM) 1250 (500 CA) MG tablet Take 1 tablet by mouth daily with breakfast.   Yes Historical Provider, MD  Cholecalciferol (VITAMIN D-3) 1000 UNITS CAPS Take 1 capsule by mouth daily.   Yes Historical Provider, MD  glucosamine-chondroitin 500-400 MG tablet Take 1 tablet by mouth 3 (three) times daily.   Yes Historical Provider, MD  ibuprofen (ADVIL,MOTRIN) 200 MG tablet Take 200 mg by mouth every 6 (six) hours as needed for moderate pain.   Yes Historical Provider, MD  KRILL OIL PO Take 1 capsule by mouth daily.   Yes Historical Provider, MD  peg 3350 powder (MOVIPREP) 100 G SOLR Take 1 kit (200 g total) by mouth as directed. 03/20/15  Yes Daneil Dolin, MD  vitamin B-12 (CYANOCOBALAMIN) 1000 MCG tablet Take 1,000 mcg by mouth daily.   Yes Historical Provider, MD    Allergies as of 03/10/2015 - Review Complete 03/10/2015  Allergen Reaction Noted  . Codeine      Family History  Problem Relation Age of Onset  . Cancer Mother     colon  . Other Mother     aneursym  . Cancer Brother     colon  . Cancer Sister     bone,breast    History   Social History  . Marital Status: Divorced   Spouse Name: N/A  . Number of Children: N/A  . Years of Education: N/A   Occupational History  . Not on file.   Social History Main Topics  . Smoking status: Light Tobacco Smoker -- 0.10 packs/day for 12 years    Types: Cigarettes  . Smokeless tobacco: Never Used     Comment: smokes 1 pack per week  . Alcohol Use: No     Comment: occ.  . Drug Use: No  . Sexual Activity: Not Currently    Birth Control/ Protection: Post-menopausal   Other Topics Concern  . Not on file   Social History Narrative    Review of Systems: See HPI, otherwise negative ROS  Physical Exam: BP 137/86 mmHg  Pulse 75  Temp(Src) 98.1 F (36.7 C) (Oral)  Resp 18  Ht '5\' 5"'  (1.651 m)  Wt 169 lb (76.658 kg)  BMI 28.12 kg/m2  SpO2 95% General:   Alert,  Well-developed, well-nourished, pleasant and cooperative in NAD Head:  Normocephalic and atraumatic. Eyes:  Sclera clear, no icterus.   Conjunctiva pink. Ears:  Normal auditory acuity. Nose:  No deformity, discharge,  or lesions. Mouth:  No deformity or lesions, dentition normal. Neck:  Supple; no masses or thyromegaly. Lungs:  Clear throughout to auscultation.   No wheezes, crackles, or  rhonchi. No acute distress. Heart:  Regular rate and rhythm; no murmurs, clicks, rubs,  or gallops. Abdomen:  Soft, nontender and nondistended. No masses, hepatosplenomegaly or hernias noted. Normal bowel sounds, without guarding, and without rebound.   Msk:  Symmetrical without gross deformities. Normal posture. Pulses:  Normal pulses noted. Extremities:  Without clubbing or edema. Neurologic:  Alert and  oriented x4;  grossly normal neurologically. Skin:  Intact without significant lesions or rashes. Cervical Nodes:  No significant cervical adenopathy. Psych:  Alert and cooperative. Normal mood and affect.  Impression/Plan: Misty Wilson is now here to undergo a screening colonoscopy.  High risk screening examination. Risks, benefits, limitations,  imponderables and alternatives regarding colonoscopy have been reviewed with the patient. Questions have been answered. All parties agreeable.     Notice:  This dictation was prepared with Dragon dictation along with smaller phrase technology. Any transcriptional errors that result from this process are unintentional and may not be corrected upon review.

## 2015-04-06 NOTE — Op Note (Signed)
[facility] [address] [place], [address]   COLONOSCOPY PROCEDURE REPORT  PATIENT: Misty Wilson, Misty Wilson  MR#: [id number] BIRTHDATE: December 01, 1940 , 73  yrs. old GENDER: female ENDOSCOPIST: R.  Garfield Cornea, MD FACP [facility] REFERRED [id number] [place] PROCEDURE DATE:  2015-04-29 PROCEDURE:    Ileo-colonoscopy with biopsy and snare polypectomy INDICATIONS:High risk screening examination; positive family history of colon cancer in 2 first-degree relatives. MEDICATIONS: Versed 5 mg IV and Demerol 100 mg IV in divided doses. Zofran 4 mg IV. ASA CLASS:       Class II  CONSENT: The risks, benefits, alternatives and imponderables including but not limited to bleeding, perforation as well as the possibility of a missed lesion have been reviewed.  The potential for biopsy, lesion removal, etc. have also been discussed. Questions have been answered.  All parties agreeable.  Please see the history and physical in the medical record for more information.  DESCRIPTION OF PROCEDURE:   After the risks benefits and alternatives of the procedure were thoroughly explained, informed consent was obtained.  The digital rectal exam revealed no abnormalities of the rectum.   The EC-[id number] ([id number])  endoscope was introduced through the anus and advanced to the terminal ileum which was intubated for a short distance. No adverse events experienced.   The quality of the prep was adequate  The instrument was then slowly withdrawn as the colon was fully examined.      COLON FINDINGS: 1.25 cm pedunculated polyp in the distal rectum within 2 cm of the anal verge.  The patient also had (2) diminutive polyps in the more proximal rectum at 8 cm from the anal verge; otherwise, the remainder of the rectal mucosa appeared normal. Left-sided diverticula present; the remainder of the colonic mucosa appeared normal.  The distal 5 cm of terminal ileal mucosa also appeared normal.  Retroflexion was  performed. .  The large distal rectal polyp was removed completely with hot snare cautery. The diminutive polyps removed with cold biopsy forcep technique.  Withdrawal time=13 minutes 0 seconds.  The scope was withdrawn and the procedure completed. COMPLICATIONS: none  ENDOSCOPIC IMPRESSION: Multiple rectal polyps?"removed as described above.  RECOMMENDATIONS: Follow-up on pathology. Further recommendations to follow.  eSigned:  R. Garfield Cornea, MD Rosalita Chessman [facility] April 29, 2015 10:03 AM   cc:  CPT CODES: ICD CODES:  The ICD and CPT codes recommended by this software are interpretations from the data that the clinical staff has captured with the software.  The verification of the translation of this report to the ICD and CPT codes and modifiers is the sole responsibility of the health care institution and practicing physician where this report was generated.  [employer] will not be held responsible for the validity of the ICD and CPT codes included on this report.  AMA assumes no liability for data contained or not contained herein. CPT is a Designer, television/film set of the [employer].  PATIENT NAME:  Misty, Wilson MR#: [id number]

## 2015-04-07 ENCOUNTER — Telehealth: Payer: Self-pay | Admitting: Internal Medicine

## 2015-04-07 ENCOUNTER — Encounter (HOSPITAL_COMMUNITY): Payer: Self-pay | Admitting: Internal Medicine

## 2015-04-07 NOTE — Telephone Encounter (Signed)
Pt had colonoscopy done yesterday.

## 2015-04-07 NOTE — Telephone Encounter (Signed)
Pt called today to ask when her results come in that we call her at her work number [phone].

## 2015-04-09 NOTE — Telephone Encounter (Signed)
I called and spoke with the pt. Informed her that there was no cancer in her polyps and that [facility] would do a letter for her and let her know any further recommendations.

## 2015-04-22 ENCOUNTER — Encounter: Payer: Self-pay | Admitting: Internal Medicine

## 2015-07-16 ENCOUNTER — Other Ambulatory Visit: Payer: Self-pay | Admitting: Podiatry

## 2015-07-29 NOTE — Addendum Note (Signed)
Addended by: Caprice Beaver on: 07/29/2015 09:44 AM   Modules accepted: Orders

## 2015-07-31 ENCOUNTER — Encounter (HOSPITAL_COMMUNITY): Payer: Self-pay | Admitting: Emergency Medicine

## 2015-07-31 ENCOUNTER — Emergency Department (HOSPITAL_COMMUNITY)
Admission: EM | Admit: 2015-07-31 | Discharge: 2015-07-31 | Discharge status: Against Medical Advice | Payer: Medicare Other | Attending: Emergency Medicine | Admitting: Emergency Medicine

## 2015-07-31 DIAGNOSIS — Y939 Activity, unspecified: Secondary | ICD-10-CM | POA: Insufficient documentation

## 2015-07-31 DIAGNOSIS — L988 Other specified disorders of the skin and subcutaneous tissue: Secondary | ICD-10-CM | POA: Insufficient documentation

## 2015-07-31 DIAGNOSIS — Y929 Unspecified place or not applicable: Secondary | ICD-10-CM | POA: Insufficient documentation

## 2015-07-31 DIAGNOSIS — Y998 Other external cause status: Secondary | ICD-10-CM | POA: Insufficient documentation

## 2015-07-31 DIAGNOSIS — T7840XA Allergy, unspecified, initial encounter: Secondary | ICD-10-CM | POA: Diagnosis not present

## 2015-07-31 DIAGNOSIS — Z720 Tobacco use: Secondary | ICD-10-CM | POA: Insufficient documentation

## 2015-07-31 DIAGNOSIS — X58XXXA Exposure to other specified factors, initial encounter: Secondary | ICD-10-CM | POA: Insufficient documentation

## 2015-07-31 NOTE — ED Provider Notes (Signed)
CSN: [id number]     Arrival date & time 07/31/15  1315 History   First MD Initiated Contact with Patient 07/31/15 1345     Chief Complaint  Patient presents with  . Allergic Reaction     (Consider location/radiation/quality/duration/timing/severity/associated sxs/prior Treatment) The history is provided by the patient.    Patient left prior to being seen by me.  She was in the department 35 minutes at the time of her departure.  Past Medical History  Diagnosis Date  . Breast mass, left 04/25/2013   Past Surgical History  Procedure Laterality Date  . Cholecystectomy    . Veins      had varicose veins done  . Tubal ligation    . Colonoscopy N/A 04/06/2015    Procedure: COLONOSCOPY;  Surgeon: Daneil Dolin, MD;  Location: [facility];  Service: Endoscopy;  Laterality: N/A;  8:45   Family History  Problem Relation Age of Onset  . Cancer Mother     colon  . Other Mother     aneursym  . Cancer Brother     colon  . Cancer Sister     bone,breast   Social History  Substance Use Topics  . Smoking status: Light Tobacco Smoker -- 0.10 packs/day for 12 years    Types: Cigarettes  . Smokeless tobacco: Never Used     Comment: smokes 1 pack per week  . Alcohol Use: No     Comment: occ.   OB History    Gravida Para Term Preterm AB TAB SAB Ectopic Multiple Living   4 4             Review of Systems    Allergies  Codeine  Home Medications   Prior to Admission medications   Medication Sig Start Date End Date Taking? Authorizing Provider  B Complex-C (B-COMPLEX WITH VITAMIN C) tablet Take 1 tablet by mouth daily.    Historical Provider, MD  calcium carbonate (OS-CAL - DOSED IN MG OF ELEMENTAL CALCIUM) 1250 (500 CA) MG tablet Take 1 tablet by mouth daily with breakfast.    Historical Provider, MD  Cholecalciferol (VITAMIN D-3) 1000 UNITS CAPS Take 1 capsule by mouth daily.    Historical Provider, MD  glucosamine-chondroitin 500-400 MG tablet Take 1 tablet by mouth 3  (three) times daily.    Historical Provider, MD  ibuprofen (ADVIL,MOTRIN) 200 MG tablet Take 200 mg by mouth every 6 (six) hours as needed for moderate pain.    Historical Provider, MD  KRILL OIL PO Take 1 capsule by mouth daily.    Historical Provider, MD  peg 3350 powder (MOVIPREP) 100 G SOLR Take 1 kit (200 g total) by mouth as directed. 03/20/15   Daneil Dolin, MD  vitamin B-12 (CYANOCOBALAMIN) 1000 MCG tablet Take 1,000 mcg by mouth daily.    Historical Provider, MD   BP 154/90 mmHg  Pulse 79  Temp(Src) 97.6 F (36.4 C)  Resp 18  Ht '5\' 5"'  (1.651 m)  Wt 166 lb (75.297 kg)  BMI 27.62 kg/m2  SpO2 100% Physical Exam  ED Course  Procedures (including critical care time) Labs Review Labs Reviewed - No data to display  Imaging Review No results found. I have personally reviewed and evaluated these images and lab results as part of my medical decision-making.   EKG Interpretation None      MDM   Final diagnoses:  None    No diagnosis.  Not evaluated.    Evalee Jefferson, PA-C 07/31/15  [place], MD 08/03/15 1234

## 2015-07-31 NOTE — ED Notes (Signed)
Pt c/o generalized redness to skin x 30 minutes. Pt states she has had to similar episodes in the past few weeks. Denies cp/sob. No wheezing noted. Airway patent. nad noted.

## 2015-07-31 NOTE — ED Notes (Signed)
Patient out to nursing station stated "its getting better and aint nobody been in", I explained to her our provider was seeing another patient in the main ED but she would be the next patient to be seen. Patient shrugged and said "im leaving". I asked her to sign out AMA and patient made a huffing noise and walked away.

## 2015-08-11 ENCOUNTER — Other Ambulatory Visit (HOSPITAL_COMMUNITY): Payer: Medicare Other

## 2015-09-03 NOTE — Patient Instructions (Signed)
Misty Wilson  09/03/2015     @PREFPERIOPPHARMACY @   Your procedure is scheduled on  09/09/2015  Report to [facility] at  700  A.M.  Call this number if you have problems the morning of surgery:  [phone]   Remember:  Do not eat food or drink liquids after midnight.  Take these medicines the morning of surgery with A SIP OF WATER  none   Do not wear jewelry, make-up or nail polish.  Do not wear lotions, powders, or perfumes.  You may wear deodorant.  Do not shave 48 hours prior to surgery.  Men may shave face and neck.  Do not bring valuables to the hospital.  [facility] is not responsible for any belongings or valuables.  Contacts, dentures or bridgework may not be worn into surgery.  Leave your suitcase in the car.  After surgery it may be brought to your room.  For patients admitted to the hospital, discharge time will be determined by your treatment team.  Patients discharged the day of surgery will not be allowed to drive home.   Name and phone number of your driver:   family Special instructions: none  Please read over the following fact sheets that you were given. Pain Booklet, Coughing and Deep Breathing, Surgical Site Infection Prevention, Anesthesia Post-op Instructions and Care and Recovery After Surgery      Morton Neuralgia Morton neuralgia is a type of foot pain in the area closest to your toes. This area is sometimes called the ball of your foot. Morton neuralgia occurs when a branch of a nerve in your foot (digital nerve) becomes compressed.  When this happens over a long period of time, the nerve can thicken (neuroma) and cause pain. This usually occurs between the third and fourth toe. Morton neuralgia can come and go but may get worse over time.  CAUSES Your digital nerve can become compressed and stretched at a point where it passes under a thick band of tissue that connects your toes (intermetatarsal ligament). Morton neuralgia can be  caused by mild repetitive damage in this area. This type of damage can result from:   Activities such as running or jumping.  Wearing shoes that are too tight. RISK FACTORS You may be at risk for Morton neuralgia if you:  Are female.  Wear high heels.  Wear shoes that are narrow or tight.  Participate in activities that stretch your toes. These include:  Running.  Ballet.  Long-distance walking. SIGNS AND SYMPTOMS The first symptom of Morton neuralgia is pain that spreads from the ball of your foot to your toes. It may feel like you are walking on a marble. Pain usually gets worse with walking and goes away at night. Other symptoms may include numbness and cramping of your toes. DIAGNOSIS  Your health care provider will do a physical exam. When doing the exam, your health care provider may:   Squeeze your foot just behind your toe.  Ask you to move your toes to check for pain. You may also have tests on your foot to confirm the diagnosis. These may include:   An X-ray.  An MRI. TREATMENT  Treatment for Morton neuralgia may be as simple as changing the kind of shoes you wear. Other treatments may include:  Wearing a supportive pad (orthosis) under the front of your foot. This lifts your toe bones and takes pressure off the nerve.  Getting injections of numbing medicine  and anti-inflammatory medicine (steroid) in the nerve.  Having surgery to remove part of the thickened nerve. HOME CARE INSTRUCTIONS   Take medicine only as directed by your health care provider.  Wear soft-soled shoes with a wide toe area.  Stop activities that may be causing pain.  Elevate your foot when resting.  Massage your foot.  Apply ice to the injured area:   Put ice in a plastic bag.  Place a towel between your skin and the bag.  Leave the ice on for 20 minutes, 2-3 times a day.   Keep all follow-up visits as directed by your health care provider. This is important. SEEK MEDICAL  CARE IF:  Home care instructions are not helping you get better.  Your symptoms change or get worse.   This information is not intended to replace advice given to you by your health care provider. Make sure you discuss any questions you have with your health care provider.   Document Released: 01/23/2001 Document Revised: 11/07/2014 Document Reviewed: 12/18/2013 Elsevier Interactive Patient Education 2016 [employer] PATIENT INSTRUCTIONS POST-ANESTHESIA  IMMEDIATELY FOLLOWING SURGERY:  Do not drive or operate machinery for the first twenty four hours after surgery.  Do not make any important decisions for twenty four hours after surgery or while taking narcotic pain medications or sedatives.  If you develop intractable nausea and vomiting or a severe headache please notify your doctor immediately.  FOLLOW-UP:  Please make an appointment with your surgeon as instructed. You do not need to follow up with anesthesia unless specifically instructed to do so.  WOUND CARE INSTRUCTIONS (if applicable):  Keep a dry clean dressing on the anesthesia/puncture wound site if there is drainage.  Once the wound has quit draining you may leave it open to air.  Generally you should leave the bandage intact for twenty four hours unless there is drainage.  If the epidural site drains for more than 36-48 hours please call the anesthesia department.  QUESTIONS?:  Please feel free to call your physician or the hospital operator if you have any questions, and they will be happy to assist you.

## 2015-09-04 ENCOUNTER — Encounter (HOSPITAL_COMMUNITY)
Admission: RE | Admit: 2015-09-04 | Discharge: 2015-09-04 | Disposition: A | Discharge status: Home / Self Care | Payer: Medicare Other | Source: Ambulatory Visit | Attending: Podiatry | Admitting: Podiatry

## 2015-11-05 ENCOUNTER — Other Ambulatory Visit: Payer: Self-pay | Admitting: Podiatry

## 2015-12-03 NOTE — Patient Instructions (Signed)
Misty Wilson  12/03/2015     @PREFPERIOPPHARMACY @   Your procedure is scheduled on 12/09/2015.  Report to [facility] at 7:00 A.M.  Call this number if you have problems the morning of surgery:  [phone]   Remember:  Do not eat food or drink liquids after midnight.  Take these medicines the morning of surgery with A SIP OF WATER none   Do not wear jewelry, make-up or nail polish.  Do not wear lotions, powders, or perfumes.  You may wear deodorant.  Do not shave 48 hours prior to surgery.  Men may shave face and neck.  Do not bring valuables to the hospital.  [facility] is not responsible for any belongings or valuables.  Contacts, dentures or bridgework may not be worn into surgery.  Leave your suitcase in the car.  After surgery it may be brought to your room.  For patients admitted to the hospital, discharge time will be determined by your treatment team.  Patients discharged the day of surgery will not be allowed to drive home.   Name and phone number of your driver:   family Special instructions: n/a Please read over the following fact sheets that you were given. Care and Recovery After Surgery    General Anesthesia, Adult General anesthesia is a sleep-like state of non-feeling produced by medicines (anesthetics). General anesthesia prevents you from being alert and feeling pain during a medical procedure. Your caregiver may recommend general anesthesia if your procedure:  Is long.  Is painful or uncomfortable.  Would be frightening to see or hear.  Requires you to be still.  Affects your breathing.  Causes significant blood loss. LET YOUR CAREGIVER KNOW ABOUT:  Allergies to food or medicine.  Medicines taken, including vitamins, herbs, eyedrops, over-the-counter medicines, and creams.  Use of steroids (by mouth or creams).  Previous problems with anesthetics or numbing medicines, including problems experienced by relatives.  History of bleeding  problems or blood clots.  Previous surgeries and types of anesthetics received.  Possibility of pregnancy, if this applies.  Use of cigarettes, alcohol, or illegal drugs.  Any health condition(s), especially diabetes, sleep apnea, and high blood pressure. RISKS AND COMPLICATIONS General anesthesia rarely causes complications. However, if complications do occur, they can be life threatening. Complications include:  A lung infection.  A stroke.  A heart attack.  Waking up during the procedure. When this occurs, the patient may be unable to move and communicate that he or she is awake. The patient may feel severe pain. Older adults and adults with serious medical problems are more likely to have complications than adults who are young and healthy. Some complications can be prevented by answering all of your caregiver's questions thoroughly and by following all pre-procedure instructions. It is important to tell your caregiver if any of the pre-procedure instructions, especially those related to diet, were not followed. Any food or liquid in the stomach can cause problems when you are under general anesthesia. BEFORE THE PROCEDURE  Ask your caregiver if you will have to spend the night at the hospital. If you will not have to spend the night, arrange to have an adult drive you and stay with you for 24 hours.  Follow your caregiver's instructions if you are taking dietary supplements or medicines. Your caregiver may tell you to stop taking them or to reduce your dosage.  Do not smoke for as long as possible before your procedure. If possible, stop smoking 3-6 weeks before  the procedure.  Do not take new dietary supplements or medicines within 1 week of your procedure unless your caregiver approves them.  Do not eat within 8 hours of your procedure or as directed by your caregiver. Drink only clear liquids, such as water, black coffee (without milk or cream), and fruit juices (without  pulp).  Do not drink within 3 hours of your procedure or as directed by your caregiver.  You may brush your teeth on the morning of the procedure, but make sure to spit out the toothpaste and water when finished. PROCEDURE  You will receive anesthetics through a mask, through an intravenous (IV) access tube, or through both. A doctor who specializes in anesthesia (anesthesiologist) or a nurse who specializes in anesthesia (nurse anesthetist) or both will stay with you throughout the procedure to make sure you remain unconscious. He or she will also watch your blood pressure, pulse, and oxygen levels to make sure that the anesthetics do not cause any problems. Once you are asleep, a breathing tube or mask may be used to help you breathe. AFTER THE PROCEDURE You will wake up after the procedure is complete. You may be in the room where the procedure was performed or in a recovery area. You may have a sore throat if a breathing tube was used. You may also feel:  Dizzy.  Weak.  Drowsy.  Confused.  Nauseous.  Cold. These are all normal responses and can be expected to last for up to 24 hours after the procedure is complete. A caregiver will tell you when you are ready to go home. This will usually be when you are fully awake and in stable condition.   This information is not intended to replace advice given to you by your health care provider. Make sure you discuss any questions you have with your health care provider.   Document Released: 01/24/2008 Document Revised: 11/07/2014 Document Reviewed: 02/15/2012 Elsevier Interactive Patient Education [employer].

## 2015-12-04 ENCOUNTER — Encounter (HOSPITAL_COMMUNITY)
Admission: RE | Admit: 2015-12-04 | Discharge: 2015-12-04 | Disposition: A | Discharge status: Home / Self Care | Payer: PPO | Source: Ambulatory Visit | Attending: Podiatry | Admitting: Podiatry

## 2015-12-04 ENCOUNTER — Encounter (HOSPITAL_COMMUNITY): Payer: Self-pay

## 2015-12-04 DIAGNOSIS — M79672 Pain in left foot: Secondary | ICD-10-CM | POA: Diagnosis not present

## 2015-12-04 DIAGNOSIS — Z01812 Encounter for preprocedural laboratory examination: Secondary | ICD-10-CM | POA: Diagnosis not present

## 2015-12-04 DIAGNOSIS — G5762 Lesion of plantar nerve, left lower limb: Secondary | ICD-10-CM | POA: Diagnosis not present

## 2015-12-04 LAB — BASIC METABOLIC PANEL
Anion gap: 8 (ref 5–15)
BUN: 12 mg/dL (ref 6–20)
CALCIUM: 9.7 mg/dL (ref 8.9–10.3)
CO2: 29 mmol/L (ref 22–32)
CREATININE: 0.71 mg/dL (ref 0.44–1.00)
Chloride: 106 mmol/L (ref 101–111)
GFR calc Af Amer: >60 mL/min (ref >60)
GFR calc non Af Amer: >60 mL/min (ref >60)
GLUCOSE: 112 mg/dL — AB (ref 65–99)
Potassium: 3.8 mmol/L (ref 3.5–5.1)
Sodium: 143 mmol/L (ref 135–145)

## 2015-12-04 LAB — CBC WITH DIFFERENTIAL/PLATELET
Basophils Absolute: 0 10*3/uL (ref 0.0–0.1)
Basophils Relative: 0 %
EOS ABS: 0.2 10*3/uL (ref 0.0–0.7)
Eosinophils Relative: 2 %
HCT: 38.8 % (ref 36.0–46.0)
HEMOGLOBIN: 12.8 g/dL (ref 12.0–15.0)
LYMPHS ABS: 2.9 10*3/uL (ref 0.7–4.0)
Lymphocytes Relative: 35 %
MCH: 30.2 pg (ref 26.0–34.0)
MCHC: 33 g/dL (ref 30.0–36.0)
MCV: 91.5 fL (ref 78.0–100.0)
MONOS PCT: 6 %
Monocytes Absolute: 0.5 10*3/uL (ref 0.1–1.0)
NEUTROS PCT: 57 %
Neutro Abs: 4.7 10*3/uL (ref 1.7–7.7)
PLATELETS: 262 10*3/uL (ref 150–400)
RBC: 4.24 MIL/uL (ref 3.87–5.11)
RDW: 13.3 % (ref 11.5–15.5)
WBC: 8.2 10*3/uL (ref 4.0–10.5)

## 2015-12-09 ENCOUNTER — Ambulatory Visit (HOSPITAL_COMMUNITY): Payer: PPO | Admitting: Anesthesiology

## 2015-12-09 ENCOUNTER — Ambulatory Visit (HOSPITAL_COMMUNITY)
Admission: RE | Admit: 2015-12-09 | Discharge: 2015-12-09 | Disposition: A | Discharge status: Home / Self Care | Payer: PPO | Source: Ambulatory Visit | Attending: Podiatry | Admitting: Podiatry

## 2015-12-09 ENCOUNTER — Encounter (HOSPITAL_COMMUNITY): Payer: Self-pay | Admitting: *Deleted

## 2015-12-09 ENCOUNTER — Encounter (HOSPITAL_COMMUNITY)
Admission: RE | Disposition: A | Discharge status: Home / Self Care | Payer: Self-pay | Source: Ambulatory Visit | Attending: Podiatry

## 2015-12-09 DIAGNOSIS — G5782 Other specified mononeuropathies of left lower limb: Secondary | ICD-10-CM | POA: Diagnosis not present

## 2015-12-09 DIAGNOSIS — F17200 Nicotine dependence, unspecified, uncomplicated: Secondary | ICD-10-CM | POA: Insufficient documentation

## 2015-12-09 DIAGNOSIS — D3613 Benign neoplasm of peripheral nerves and autonomic nervous system of lower limb, including hip: Secondary | ICD-10-CM | POA: Diagnosis not present

## 2015-12-09 DIAGNOSIS — G5762 Lesion of plantar nerve, left lower limb: Secondary | ICD-10-CM | POA: Diagnosis not present

## 2015-12-09 DIAGNOSIS — M79672 Pain in left foot: Secondary | ICD-10-CM | POA: Diagnosis not present

## 2015-12-09 HISTORY — DX: Gastric ulcer, unspecified as acute or chronic, without hemorrhage or perforation: K25.9

## 2015-12-09 HISTORY — PX: EXCISION MORTON'S NEUROMA: SHX5013

## 2015-12-09 SURGERY — EXCISION, MORTON'S NEUROMA
Anesthesia: Monitor Anesthesia Care | Site: Foot | Laterality: Left

## 2015-12-09 MED ORDER — ONDANSETRON HCL 4 MG/2ML IJ SOLN
4.0000 mg | Freq: Once | INTRAMUSCULAR | Status: AC
  Administered 2015-12-09: 4 mg via INTRAVENOUS

## 2015-12-09 MED ORDER — FENTANYL CITRATE (PF) 100 MCG/2ML IJ SOLN
25.0000 ug | INTRAMUSCULAR | Status: AC
  Administered 2015-12-09 (×2): 25 ug via INTRAVENOUS

## 2015-12-09 MED ORDER — VANCOMYCIN HCL 1000 MG IV SOLR
2000.0000 mg | INTRAVENOUS | Status: DC | PRN
  Administered 2015-12-09: 2000 mg via INTRAVENOUS

## 2015-12-09 MED ORDER — DEXAMETHASONE SODIUM PHOSPHATE 4 MG/ML IJ SOLN
INTRAMUSCULAR | Status: AC
  Filled 2015-12-09: qty 1

## 2015-12-09 MED ORDER — LACTATED RINGERS IV SOLN
INTRAVENOUS | Status: DC
  Administered 2015-12-09: 08:00:00 via INTRAVENOUS

## 2015-12-09 MED ORDER — BUPIVACAINE HCL (PF) 0.5 % IJ SOLN
INTRAMUSCULAR | Status: DC | PRN
  Administered 2015-12-09: 16 mL

## 2015-12-09 MED ORDER — FENTANYL CITRATE (PF) 100 MCG/2ML IJ SOLN
INTRAMUSCULAR | Status: DC | PRN
  Administered 2015-12-09 (×2): 25 ug via INTRAVENOUS

## 2015-12-09 MED ORDER — BUPIVACAINE HCL (PF) 0.5 % IJ SOLN
INTRAMUSCULAR | Status: AC
  Filled 2015-12-09: qty 30

## 2015-12-09 MED ORDER — LIDOCAINE HCL 1 % IJ SOLN
INTRAMUSCULAR | Status: DC | PRN
  Administered 2015-12-09: 2 mL

## 2015-12-09 MED ORDER — PROPOFOL 500 MG/50ML IV EMUL
INTRAVENOUS | Status: DC | PRN
  Administered 2015-12-09: 100 ug/kg/min via INTRAVENOUS

## 2015-12-09 MED ORDER — MIDAZOLAM HCL 5 MG/5ML IJ SOLN
INTRAMUSCULAR | Status: DC | PRN
  Administered 2015-12-09 (×2): 1 mg via INTRAVENOUS

## 2015-12-09 MED ORDER — CLINDAMYCIN PHOSPHATE 600 MG/50ML IV SOLN: 600.0000 mg | Freq: Once | INTRAVENOUS | Status: DC

## 2015-12-09 MED ORDER — DEXAMETHASONE SODIUM PHOSPHATE 4 MG/ML IJ SOLN
4.0000 mg | Freq: Once | INTRAMUSCULAR | Status: AC
  Administered 2015-12-09: 4 mg via INTRAVENOUS

## 2015-12-09 MED ORDER — MIDAZOLAM HCL 2 MG/2ML IJ SOLN
INTRAMUSCULAR | Status: AC
  Filled 2015-12-09: qty 2

## 2015-12-09 MED ORDER — FENTANYL CITRATE (PF) 100 MCG/2ML IJ SOLN
25.0000 ug | INTRAMUSCULAR | Status: DC | PRN
Start: 2015-12-09 — End: 2015-12-09

## 2015-12-09 MED ORDER — ONDANSETRON HCL 4 MG/2ML IJ SOLN
INTRAMUSCULAR | Status: AC
  Filled 2015-12-09: qty 2

## 2015-12-09 MED ORDER — HYDROCODONE-ACETAMINOPHEN 10-325 MG PO TABS
ORAL_TABLET | ORAL | Status: AC
  Filled 2015-12-09: qty 1

## 2015-12-09 MED ORDER — 0.9 % SODIUM CHLORIDE (POUR BTL) OPTIME
TOPICAL | Status: DC | PRN
  Administered 2015-12-09: 1000 mL

## 2015-12-09 MED ORDER — PROPOFOL 10 MG/ML IV BOLUS
INTRAVENOUS | Status: AC
  Filled 2015-12-09: qty 40

## 2015-12-09 MED ORDER — DEXTROSE 5 % IV SOLN
INTRAVENOUS | Status: DC | PRN
  Administered 2015-12-09: 09:00:00 via INTRAVENOUS

## 2015-12-09 MED ORDER — LIDOCAINE HCL (CARDIAC) 10 MG/ML IV SOLN
INTRAVENOUS | Status: DC | PRN
  Administered 2015-12-09: 50 mg via INTRAVENOUS

## 2015-12-09 MED ORDER — LIDOCAINE HCL (PF) 1 % IJ SOLN
INTRAMUSCULAR | Status: AC
  Filled 2015-12-09: qty 30

## 2015-12-09 MED ORDER — CLINDAMYCIN PHOSPHATE 600 MG/50ML IV SOLN
600.0000 mg | Freq: Once | INTRAVENOUS | Status: DC
  Filled 2015-12-09: qty 50

## 2015-12-09 MED ORDER — MIDAZOLAM HCL 2 MG/2ML IJ SOLN
1.0000 mg | INTRAMUSCULAR | Status: DC | PRN
  Administered 2015-12-09: 2 mg via INTRAVENOUS
  Administered 2015-12-09: 1 mg via INTRAVENOUS

## 2015-12-09 MED ORDER — VANCOMYCIN HCL IN DEXTROSE 1-5 GM/200ML-% IV SOLN
INTRAVENOUS | Status: AC
  Filled 2015-12-09: qty 400

## 2015-12-09 MED ORDER — FENTANYL CITRATE (PF) 100 MCG/2ML IJ SOLN
INTRAMUSCULAR | Status: AC
  Filled 2015-12-09: qty 2

## 2015-12-09 MED ORDER — LIDOCAINE HCL (PF) 1 % IJ SOLN
INTRAMUSCULAR | Status: AC
  Filled 2015-12-09: qty 5

## 2015-12-09 MED ORDER — ONDANSETRON HCL 4 MG/2ML IJ SOLN: 4.0000 mg | Freq: Once | INTRAMUSCULAR | Status: DC | PRN

## 2015-12-09 MED ORDER — HYDROCODONE-ACETAMINOPHEN 10-325 MG PO TABS
1.0000 | ORAL_TABLET | Freq: Once | ORAL | Status: AC
  Administered 2015-12-09: 1 via ORAL

## 2015-12-09 SURGICAL SUPPLY — 52 items
BAG HAMPER (MISCELLANEOUS) ×3 IMPLANT
BANDAGE ELASTIC 4 VELCRO NS (GAUZE/BANDAGES/DRESSINGS) ×3 IMPLANT
BANDAGE ESMARK 4X12 BL STRL LF (DISPOSABLE) ×1 IMPLANT
BENZOIN TINCTURE PRP APPL 2/3 (GAUZE/BANDAGES/DRESSINGS) ×3 IMPLANT
BLADE 15 SAFETY STRL DISP (BLADE) ×3 IMPLANT
BLADE AVERAGE 25MMX9MM (BLADE)
BLADE AVERAGE 25X9 (BLADE) IMPLANT
BNDG CONFORM 2 STRL LF (GAUZE/BANDAGES/DRESSINGS) IMPLANT
BNDG ESMARK 4X12 BLUE STRL LF (DISPOSABLE) ×3
BNDG GAUZE ELAST 4 BULKY (GAUZE/BANDAGES/DRESSINGS) ×3 IMPLANT
BOOT STEPPER DURA LG (SOFTGOODS) IMPLANT
BOOT STEPPER DURA MED (SOFTGOODS) IMPLANT
BOOT STEPPER DURA SM (SOFTGOODS) IMPLANT
BOOT STEPPER DURA XLG (SOFTGOODS) IMPLANT
CHLORAPREP W/TINT 26ML (MISCELLANEOUS) ×3 IMPLANT
CLOSURE WOUND 1/2 X4 (GAUZE/BANDAGES/DRESSINGS) ×1
CLOTH BEACON ORANGE TIMEOUT ST (SAFETY) ×3 IMPLANT
COVER LIGHT HANDLE STERIS (MISCELLANEOUS) ×6 IMPLANT
CUFF TOURNIQUET SINGLE 18IN (TOURNIQUET CUFF) ×3 IMPLANT
DECANTER SPIKE VIAL GLASS SM (MISCELLANEOUS) ×3 IMPLANT
DRAPE OEC MINIVIEW 54X84 (DRAPES) ×3 IMPLANT
DRSG ADAPTIC 3X8 NADH LF (GAUZE/BANDAGES/DRESSINGS) ×3 IMPLANT
ELECT REM PT RETURN 9FT ADLT (ELECTROSURGICAL) ×3
ELECTRODE REM PT RTRN 9FT ADLT (ELECTROSURGICAL) ×1 IMPLANT
FORMALIN 10 PREFIL 120ML (MISCELLANEOUS) ×3 IMPLANT
GAUZE KERLIX 2  STERILE LF (GAUZE/BANDAGES/DRESSINGS) ×3 IMPLANT
GAUZE SPONGE 4X4 12PLY STRL (GAUZE/BANDAGES/DRESSINGS) ×2 IMPLANT
GLOVE BIO SURGEON STRL SZ7 (GLOVE) ×3 IMPLANT
GLOVE BIO SURGEON STRL SZ7.5 (GLOVE) ×3 IMPLANT
GLOVE BIOGEL PI IND STRL 7.0 (GLOVE) ×2 IMPLANT
GLOVE BIOGEL PI IND STRL 7.5 (GLOVE) ×1 IMPLANT
GLOVE BIOGEL PI INDICATOR 7.0 (GLOVE) ×4
GLOVE BIOGEL PI INDICATOR 7.5 (GLOVE) ×2
GOWN STRL REUS W/TWL LRG LVL3 (GOWN DISPOSABLE) ×6 IMPLANT
K-WIRE 6 (WIRE)
KIT ROOM TURNOVER APOR (KITS) ×3 IMPLANT
KWIRE 6 (WIRE) IMPLANT
MARKER SKIN DUAL TIP RULER LAB (MISCELLANEOUS) ×3 IMPLANT
NEEDLE HYPO 27GX1-1/4 (NEEDLE) ×9 IMPLANT
NS IRRIG 1000ML POUR BTL (IV SOLUTION) ×3 IMPLANT
PACK BASIC LIMB (CUSTOM PROCEDURE TRAY) ×3 IMPLANT
PAD ARMBOARD 7.5X6 YLW CONV (MISCELLANEOUS) ×3 IMPLANT
RASP SM TEAR CROSS CUT (RASP) ×3 IMPLANT
SET BASIN LINEN APH (SET/KITS/TRAYS/PACK) ×3 IMPLANT
SPONGE GAUZE 4X4 12PLY (GAUZE/BANDAGES/DRESSINGS) ×3 IMPLANT
SPONGE LAP 18X18 X RAY DECT (DISPOSABLE) ×3 IMPLANT
STOCKINETTE IMPERVIOUS LG (DRAPES) ×3 IMPLANT
STRIP CLOSURE SKIN 1/2X4 (GAUZE/BANDAGES/DRESSINGS) ×2 IMPLANT
SUT BONE WAX W31G (SUTURE) ×3 IMPLANT
SUT VIC AB 4-0 PS2 27 (SUTURE) ×3 IMPLANT
SYR CONTROL 10ML LL (SYRINGE) ×9 IMPLANT
TOWEL OR 17X26 4PK STRL BLUE (TOWEL DISPOSABLE) ×3 IMPLANT

## 2015-12-09 NOTE — Anesthesia Preprocedure Evaluation (Signed)
Anesthesia Evaluation  Patient identified by MRN, date of birth, ID band Patient awake    Reviewed: Allergy & Precautions, NPO status , Patient's Chart, lab work & pertinent test results  Airway Mallampati: II  TM Distance: >3 FB     Dental  (+) Edentulous Upper, Dental Advisory Given   Pulmonary Current Smoker,    breath sounds clear to auscultation       Cardiovascular negative cardio ROS   Rhythm:Regular Rate:Normal     Neuro/Psych    GI/Hepatic PUD,   Endo/Other    Renal/GU      Musculoskeletal   Abdominal   Peds  Hematology   Anesthesia Other Findings   Reproductive/Obstetrics                             Anesthesia Physical Anesthesia Plan  ASA: II  Anesthesia Plan: MAC   Post-op Pain Management:    Induction: Intravenous  Airway Management Planned: Nasal Cannula  Additional Equipment:   Intra-op Plan:   Post-operative Plan:   Informed Consent: I have reviewed the patients History and Physical, chart, labs and discussed the procedure including the risks, benefits and alternatives for the proposed anesthesia with the patient or authorized representative who has indicated his/her understanding and acceptance.     Plan Discussed with:   Anesthesia Plan Comments:         Anesthesia Quick Evaluation

## 2015-12-09 NOTE — Progress Notes (Signed)
Ready to go home. Dressed self. Dressing to left foot intact with small amt bright red drainage. Dr Caprice Beaver notified. Do not discharge pt til he can check and redress left foot. Pt notified and voiced understanding. Coke given to drink. Resting quietly on stretcher with left foot elevated.

## 2015-12-09 NOTE — Op Note (Signed)
OPERATIVE NOTE  DATE OF PROCEDURE 12/09/2015  SURGEON Marcheta Grammes, [place]  OR STAFF Circulator: Sue Lush, RN Scrub Person: Karin Lieu, [place] Circulator Assistant: Towanda Malkin, RN   PREOPERATIVE DIAGNOSIS Neuroma of third intermetatarsal space, left foot  POSTOPERATIVE DIAGNOSIS Same  PROCEDURE Excision of neuroma from third intermetatarsal space, left foot  ANESTHESIA Monitor Anesthesia Care   HEMOSTASIS Pneumatic ankle tourniquet set at 250 mmHg  ESTIMATED BLOOD LOSS Minimal (<5 cc)  MATERIALS USED None  INJECTABLES 0.5% Marcaine plain and 1% Xylocaine plain  PATHOLOGY Neuroma from third intermetatarsal space, left foot  COMPLICATIONS None  INDICATIONS: Pain in the third intermetatarsal space of the left foot nonresponsive to nonsurgical management by Dr. Berline Lopes.  She was referred for surgical management.  DESCRIPTION OF THE PROCEDURE:  The patient was brought to the operating room and placed on the operative table in the supine position.  A pneumatic ankle tourniquet was applied to the operative extremity.  Following sedation, the surgical site was anesthetized with 0.5% Marcaine plain.  The foot was then prepped, scrubbed, and draped in the usual sterile technique.  The foot was elevated, exsanguinated and the pneumatic ankle tourniquet inflated to 250 mmHg.    Attention was directed to the third interspace where a longitudinal incision was made extending from the webspace proximally.  The incision was deepened via sharp and blunt dissection.  The deep transverse metatarsal ligament between the third and fourth metatarsals was released.  The neuroma was identified and removed in toto.  The specimen was sent to pathology.  The area was then flushed with copious amounts of sterile saline.  The deep structures were reapproximated using 4-0 Vicryl.  The subcutaneous structures reapproximated using 4-0 Vicryl.  The skin was reapproximated using 4-0 Vicryl in  a running subcuticular manner.  Wound closure was reinforced with Steri-Strips.  A sterile compressive dressing was applied to the operative foot.  The pneumatic ankle tourniquet was deflated and a prompt hyperemic response was noted to all digits of the left foot.  The patient tolerated the procedure well.  The patient was then transferred to PACU with vital signs stable and vascular status intact to all toes of the operative foot.

## 2015-12-09 NOTE — Anesthesia Procedure Notes (Signed)
Procedure Name: MAC Date/Time: 12/09/2015 8:37 AM Performed by: Vista Deck Pre-anesthesia Checklist: Patient identified, Emergency Drugs available, Suction available, Timeout performed and Patient being monitored Patient Re-evaluated:Patient Re-evaluated prior to inductionOxygen Delivery Method: Nasal Cannula

## 2015-12-09 NOTE — Progress Notes (Addendum)
Dr Caprice Beaver at bedside to check drsg left foot. Drsg to left foot removed. Incision intact and without active bleeding. Left toes pink in color/warm to touch. Moves left toes without difficulty. Sterile gauze drsg and ace wrap applied per Dr Caprice Beaver. Tolerated well. D/C to home in good condition.

## 2015-12-09 NOTE — Discharge Instructions (Signed)

## 2015-12-09 NOTE — H&P (Signed)
HISTORY AND PHYSICAL INTERVAL NOTE:  12/09/2015  8:24 AM  Misty Wilson  has presented today for surgery, with the diagnosis of neuroma left foot, left foot pain.  The various methods of treatment have been discussed with the patient.  No guarantees were given.  After consideration of risks, benefits and other options for treatment, the patient has consented to surgery.  I have reviewed the patients' chart and labs.    Patient Vitals for the past 24 hrs:  BP Temp Temp src Pulse Resp SpO2 Height Weight  12/09/15 0815 (!) 143/76 mmHg - - - - 97 % - -  12/09/15 0810 (!) 145/73 mmHg - - - - 97 % - -  12/09/15 0805 (!) 154/79 mmHg - - - - 98 % - -  12/09/15 0800 (!) 161/83 mmHg - - - - 96 % - -  12/09/15 0755 - - - - - 96 % - -  12/09/15 0750 (!) 153/85 mmHg - - - - 94 % - -  12/09/15 0732 (!) 153/80 mmHg 98.1 F (36.7 C) Oral 66 15 97 % 5\' 5"  (1.651 m) 167 lb (75.751 kg)    A history and physical examination was performed in my office.  The patient was reexamined.  She is allergic to mycin.  It causes stomach spasms.  There have been no changes to her physical examination.  Marcheta Grammes, DPM

## 2015-12-09 NOTE — Transfer of Care (Signed)
Immediate Anesthesia Transfer of Care Note  Patient: Misty Wilson  Procedure(s) Performed: Procedure(s): EXCISION NEUROMA LEFT FOOT (Left)  Patient Location: PACU  Anesthesia Type:MAC  Level of Consciousness: awake, alert , oriented and patient cooperative  Airway & Oxygen Therapy: Patient Spontanous Breathing and Patient connected to nasal cannula oxygen  Post-op Assessment: Report given to RN, Post -op Vital signs reviewed and stable and Patient moving all extremities  Post vital signs: Reviewed and stable  Last Vitals:  Filed Vitals:   12/09/15 0830 12/09/15 0834  BP: 138/76 132/78  Pulse:    Temp:    Resp:      Complications: No apparent anesthesia complications

## 2015-12-09 NOTE — Anesthesia Postprocedure Evaluation (Signed)
Anesthesia Post Note  Patient: Misty Wilson  Procedure(s) Performed: Procedure(s) (LRB): EXCISION NEUROMA LEFT FOOT (Left)  Patient location during evaluation: PACU Anesthesia Type: MAC Level of consciousness: awake and alert, oriented and patient cooperative Pain management: pain level controlled Vital Signs Assessment: post-procedure vital signs reviewed and stable Respiratory status: spontaneous breathing and patient connected to nasal cannula oxygen Cardiovascular status: blood pressure returned to baseline Postop Assessment: no signs of nausea or vomiting Anesthetic complications: no    Last Vitals:  Filed Vitals:   12/09/15 0830 12/09/15 0834  BP: 138/76 132/78  Pulse:    Temp:    Resp:      Last Pain: There were no vitals filed for this visit.               Sergi Gellner J

## 2015-12-09 NOTE — Progress Notes (Signed)
Patient has cleocin ordered for prophylaxis. Discussed with Dr. Caprice Beaver. Will give Vancomycin 2 grams.

## 2015-12-10 ENCOUNTER — Encounter (HOSPITAL_COMMUNITY): Payer: Self-pay | Admitting: Podiatry

## 2016-03-01 DIAGNOSIS — E785 Hyperlipidemia, unspecified: Secondary | ICD-10-CM | POA: Diagnosis not present

## 2016-03-01 DIAGNOSIS — E538 Deficiency of other specified B group vitamins: Secondary | ICD-10-CM | POA: Diagnosis not present

## 2016-03-01 DIAGNOSIS — Z0389 Encounter for observation for other suspected diseases and conditions ruled out: Secondary | ICD-10-CM | POA: Diagnosis not present

## 2016-03-01 DIAGNOSIS — E559 Vitamin D deficiency, unspecified: Secondary | ICD-10-CM | POA: Diagnosis not present

## 2016-03-01 DIAGNOSIS — R5383 Other fatigue: Secondary | ICD-10-CM | POA: Diagnosis not present

## 2016-03-16 DIAGNOSIS — E785 Hyperlipidemia, unspecified: Secondary | ICD-10-CM | POA: Diagnosis not present

## 2016-03-16 DIAGNOSIS — R5383 Other fatigue: Secondary | ICD-10-CM | POA: Diagnosis not present

## 2016-03-16 DIAGNOSIS — E559 Vitamin D deficiency, unspecified: Secondary | ICD-10-CM | POA: Diagnosis not present

## 2016-05-18 ENCOUNTER — Other Ambulatory Visit: Payer: Self-pay | Admitting: Physician Assistant

## 2016-05-18 DIAGNOSIS — D225 Melanocytic nevi of trunk: Secondary | ICD-10-CM | POA: Diagnosis not present

## 2016-05-18 DIAGNOSIS — C44519 Basal cell carcinoma of skin of other part of trunk: Secondary | ICD-10-CM | POA: Diagnosis not present

## 2016-05-18 DIAGNOSIS — D485 Neoplasm of uncertain behavior of skin: Secondary | ICD-10-CM | POA: Diagnosis not present

## 2016-05-18 DIAGNOSIS — L570 Actinic keratosis: Secondary | ICD-10-CM | POA: Diagnosis not present

## 2016-05-18 DIAGNOSIS — D0439 Carcinoma in situ of skin of other parts of face: Secondary | ICD-10-CM | POA: Diagnosis not present

## 2016-06-09 DIAGNOSIS — D0439 Carcinoma in situ of skin of other parts of face: Secondary | ICD-10-CM | POA: Diagnosis not present

## 2016-06-09 DIAGNOSIS — C44519 Basal cell carcinoma of skin of other part of trunk: Secondary | ICD-10-CM | POA: Diagnosis not present

## 2016-09-13 ENCOUNTER — Other Ambulatory Visit: Payer: Self-pay | Admitting: Physician Assistant

## 2016-09-13 DIAGNOSIS — L570 Actinic keratosis: Secondary | ICD-10-CM | POA: Diagnosis not present

## 2016-09-13 DIAGNOSIS — D492 Neoplasm of unspecified behavior of bone, soft tissue, and skin: Secondary | ICD-10-CM | POA: Diagnosis not present

## 2016-12-14 DIAGNOSIS — L570 Actinic keratosis: Secondary | ICD-10-CM | POA: Diagnosis not present

## 2017-02-23 DIAGNOSIS — D225 Melanocytic nevi of trunk: Secondary | ICD-10-CM | POA: Diagnosis not present

## 2017-02-23 DIAGNOSIS — L814 Other melanin hyperpigmentation: Secondary | ICD-10-CM | POA: Diagnosis not present

## 2017-02-23 DIAGNOSIS — Z85828 Personal history of other malignant neoplasm of skin: Secondary | ICD-10-CM | POA: Diagnosis not present

## 2017-02-23 DIAGNOSIS — L578 Other skin changes due to chronic exposure to nonionizing radiation: Secondary | ICD-10-CM | POA: Diagnosis not present

## 2017-02-23 DIAGNOSIS — L821 Other seborrheic keratosis: Secondary | ICD-10-CM | POA: Diagnosis not present

## 2017-02-23 DIAGNOSIS — D1801 Hemangioma of skin and subcutaneous tissue: Secondary | ICD-10-CM | POA: Diagnosis not present

## 2017-04-04 DIAGNOSIS — H2513 Age-related nuclear cataract, bilateral: Secondary | ICD-10-CM | POA: Diagnosis not present

## 2017-04-04 DIAGNOSIS — H524 Presbyopia: Secondary | ICD-10-CM | POA: Diagnosis not present

## 2017-07-04 DIAGNOSIS — M722 Plantar fascial fibromatosis: Secondary | ICD-10-CM | POA: Diagnosis not present

## 2017-07-04 DIAGNOSIS — M79671 Pain in right foot: Secondary | ICD-10-CM | POA: Diagnosis not present

## 2017-08-15 DIAGNOSIS — M722 Plantar fascial fibromatosis: Secondary | ICD-10-CM | POA: Diagnosis not present

## 2017-08-15 DIAGNOSIS — M79671 Pain in right foot: Secondary | ICD-10-CM | POA: Diagnosis not present

## 2017-12-19 ENCOUNTER — Other Ambulatory Visit: Payer: Medicare Other | Admitting: Adult Health

## 2018-01-02 DIAGNOSIS — L570 Actinic keratosis: Secondary | ICD-10-CM | POA: Diagnosis not present

## 2018-01-02 DIAGNOSIS — D229 Melanocytic nevi, unspecified: Secondary | ICD-10-CM | POA: Diagnosis not present

## 2018-02-20 DIAGNOSIS — M79671 Pain in right foot: Secondary | ICD-10-CM | POA: Diagnosis not present

## 2018-02-20 DIAGNOSIS — M722 Plantar fascial fibromatosis: Secondary | ICD-10-CM | POA: Diagnosis not present

## 2018-02-22 ENCOUNTER — Encounter: Payer: Self-pay | Admitting: Internal Medicine

## 2018-04-03 ENCOUNTER — Ambulatory Visit (INDEPENDENT_AMBULATORY_CARE_PROVIDER_SITE_OTHER): Payer: Self-pay

## 2018-04-03 DIAGNOSIS — Z86010 Personal history of colonic polyps: Secondary | ICD-10-CM

## 2018-04-03 MED ORDER — NA SULFATE-K SULFATE-MG SULF 17.5-3.13-1.6 GM/177ML PO SOLN: 1.0000 | ORAL | 0 refills | Status: DC

## 2018-04-03 NOTE — Progress Notes (Signed)
Appropriate.

## 2018-04-03 NOTE — Patient Instructions (Signed)
Misty Wilson  1940-11-26 MRN: [id number]     Procedure Date: 06/12/18 Time to register: 8:30 Place to register: [facility] Procedure Time: 9:30 Scheduled provider: R. Garfield Cornea, MD    PREPARATION FOR COLONOSCOPY WITH SUPREP BOWEL PREP KIT  Note: Suprep Bowel Prep Kit is a split-dose (2day) regimen. Consumption of BOTH 6-ounce bottles is required for a complete prep.  Please notify us immediately if you are diabetic, take iron supplements, or if you are on Coumadin or any other blood thinners.                                                                                                                                                    2 DAYS BEFORE PROCEDURE:  DATE: 06/10/18   DAY: Sunday Begin clear liquid diet AFTER your lunch meal. NO SOLID FOODS after this point.  1 DAY BEFORE PROCEDURE:  DATE: 06/11/18   DAY: Monday Continue clear liquids the entire day - NO SOLID FOOD.     At 6:00pm: Complete steps 1 through 4 below, using ONE (1) 6-ounce bottle, before going to bed. Step 1:  Pour ONE (1) 6-ounce bottle of SUPREP liquid into the mixing container.  Step 2:  Add cool drinking water to the 16 ounce line on the container and mix.  Note: Dilute the solution concentrate as directed prior to use. Step 3:  DRINK ALL the liquid in the container. Step 4:  You MUST drink an additional two (2) or more 16 ounce containers of water  over the next one (1) hour.   Continue clear liquids only  EXCEPTION: If you take medications for your heart, blood pressure, or breathing, you may take these medications with a small amount of clear liquid.   DAY OF PROCEDURE:   DATE: 06/12/18  DAY: Tuesday    5 hours before your procedure at 4:30am: Step 1:  Pour ONE (1) 6-ounce bottle of SUPREP liquid into the mixing container.  Step 2:  Add cool drinking water to the 16 ounce line on the container and mix.  Note: Dilute the solution concentrate as directed prior to use. Step  3:  DRINK ALL the liquid in the container. Step 4:  You MUST drink an additional two (2) or more 16 ounce containers of water  over the next one (1) hour. You MUST complete the final glass of water at least  3 hours before your colonoscopy.    Nothing by mouth past 7:30am  You may take your morning medications with sip of water unless we have instructed otherwise.    Please see below for Dietary Information.  CLEAR LIQUIDS INCLUDE:  Water Jello (NOT red in color)   Ice Popsicles (NOT red in color)   Tea (sugar ok, no milk/cream) Powdered fruit flavored drinks  Coffee (sugar ok, no milk/cream)  Gatorade/ Lemonade/ Kool-Aid  (NOT red in color)   Juice: apple, white grape, white cranberry Soft drinks  Clear bullion, consomme, broth (fat free beef/chicken/vegetable)  Carbonated beverages (any kind)  Strained chicken noodle soup Hard Candy   Remember: Clear liquids are liquids that will allow you to see your fingers on the other side of a clear glass. Be sure liquids are NOT red in color, and not cloudy, but CLEAR.  DO NOT EAT OR DRINK ANY OF THE FOLLOWING:  Dairy products of any kind   Cranberry juice Tomato juice / V8 juice   Grapefruit juice Orange juice     Red grape juice  Do not eat any solid foods, including such foods as: cereal, oatmeal, yogurt, fruits, vegetables, creamed soups, eggs, bread, crackers, pureed foods in a blender, etc.   HELPFUL HINTS FOR DRINKING PREP SOLUTION:   Make sure prep is extremely cold. Mix and refrigerate the the morning of the prep. You may also put in the freezer.   You may try mixing some Crystal Light or Country Time Lemonade if you prefer. Mix in small amounts; add more if necessary.  Try drinking through a straw  Rinse mouth with water or a mouthwash between glasses, to remove after-taste.  Try sipping on a cold beverage /ice/ popsicles between glasses of prep.  Place a piece of sugar-free hard candy in mouth between glasses.  If you  become nauseated, try consuming smaller amounts, or stretch out the time between glasses. Stop for 30-60 minutes, then slowly start back drinking.     OTHER INSTRUCTIONS  You will need a responsible adult at least 76 years of age to accompany you and drive you home. This person must remain in the waiting room during your procedure. The hospital will cancel your procedure if you do not have a responsible adult with you.   1. Wear loose fitting clothing that is easily removed. 2. Leave jewelry and other valuables at home.  3. Remove all body piercing jewelry and leave at home. 4. Total time from sign-in until discharge is approximately 2-3 hours. 5. You should go home directly after your procedure and rest. You can resume normal activities the day after your procedure. 6. The day of your procedure you should not:  Drive  Make legal decisions  Operate machinery  Drink alcohol  Return to work   You may call the office (Dept: [phone]) before 5:00pm, or page the doctor on call [phone]) after 5:00pm, for further instructions, if necessary.   Insurance Information YOU WILL NEED TO CHECK WITH YOUR INSURANCE COMPANY FOR THE BENEFITS OF COVERAGE YOU HAVE FOR THIS PROCEDURE.  UNFORTUNATELY, NOT ALL INSURANCE COMPANIES HAVE BENEFITS TO COVER ALL OR PART OF THESE TYPES OF PROCEDURES.  IT IS YOUR RESPONSIBILITY TO CHECK YOUR BENEFITS, HOWEVER, WE WILL BE GLAD TO ASSIST YOU WITH ANY CODES YOUR INSURANCE COMPANY MAY NEED.    PLEASE NOTE THAT MOST INSURANCE COMPANIES WILL NOT COVER A SCREENING COLONOSCOPY FOR PEOPLE UNDER THE AGE OF 50  IF YOU HAVE BCBS INSURANCE, YOU MAY HAVE BENEFITS FOR A SCREENING COLONOSCOPY BUT IF POLYPS ARE FOUND THE DIAGNOSIS WILL CHANGE AND THEN YOU MAY HAVE A DEDUCTIBLE THAT WILL NEED TO BE MET. SO PLEASE MAKE SURE YOU CHECK YOUR BENEFITS FOR A SCREENING COLONOSCOPY AS WELL AS A DIAGNOSTIC COLONOSCOPY.

## 2018-04-03 NOTE — Progress Notes (Signed)
Gastroenterology Pre-Procedure Review  Request Date:04/03/18 Requesting Physician: 3 year recall ( last tcs 04/06/15 RMR- tubular adenoma)  PATIENT REVIEW QUESTIONS: The patient responded to the following health history questions as indicated:    1. Diabetes Melitis: no 2. Joint replacements in the past 12 months: no 3. Major health problems in the past 3 months: no 4. Has an artificial valve or MVP: no 5. Has a defibrillator: no 6. Has been advised in past to take antibiotics in advance of a procedure like teeth cleaning: no 7. Family history of colon cancer: yes (mom and brother)  35. Alcohol Use: no 9. History of sleep apnea: no  10. History of coronary artery or other vascular stents placed within the last 12 months: no 11. History of any prior anesthesia complications: yes (can only take half of the normal dose)    MEDICATIONS & ALLERGIES:    Patient reports the following regarding taking any blood thinners:   Plavix? no Aspirin? no Coumadin? no Brilinta? no Xarelto? no Eliquis? no Pradaxa? no Savaysa? no Effient? no  Patient confirms/reports the following medications:  Current Outpatient Medications  Medication Sig Dispense Refill  . B Complex-C (B-COMPLEX WITH VITAMIN C) tablet Take 1 tablet by mouth daily.    . Cholecalciferol (VITAMIN D-3) 1000 UNITS CAPS Take 1 capsule by mouth daily.    . Ginkgo Biloba (GINKOBA PO) Take by mouth.    Marland Kitchen ibuprofen (ADVIL,MOTRIN) 200 MG tablet Take 200 mg by mouth every 6 (six) hours as needed for moderate pain.    Marland Kitchen KRILL OIL PO Take 1 capsule by mouth daily.    . TURMERIC PO Take by mouth.    . vitamin B-12 (CYANOCOBALAMIN) 1000 MCG tablet Take 1,000 mcg by mouth daily.     No current facility-administered medications for this visit.     Patient confirms/reports the following allergies:  Allergies  Allergen Reactions  . Codeine     "makes me really, really sick"   . Other Nausea And Vomiting    Can not take Mycin drugs cause  terrible stomach spasms    No orders of the defined types were placed in this encounter.   AUTHORIZATION INFORMATION Primary Insurance: [employer],  ID #: [id number] Pre-Cert / Josem Kaufmann required: no   SCHEDULE INFORMATION: Procedure has been scheduled as follows:  Date: 06/12/18, Time: 9:30 Location: [facility] Dr[facility]our[facility]  This Gastroenterology Pre-Precedure Review Form is being routed to the following provider(s): Roseanne Kaufman NP

## 2018-05-01 ENCOUNTER — Encounter: Payer: Self-pay | Admitting: Adult Health

## 2018-05-01 ENCOUNTER — Ambulatory Visit (INDEPENDENT_AMBULATORY_CARE_PROVIDER_SITE_OTHER): Payer: PPO | Admitting: Adult Health

## 2018-05-01 VITALS — BP 132/83 | HR 59 | Ht 65.0 in | Wt 171.5 lb

## 2018-05-01 DIAGNOSIS — E7800 Pure hypercholesterolemia, unspecified: Secondary | ICD-10-CM | POA: Diagnosis not present

## 2018-05-01 DIAGNOSIS — Z780 Asymptomatic menopausal state: Secondary | ICD-10-CM

## 2018-05-01 DIAGNOSIS — Z1211 Encounter for screening for malignant neoplasm of colon: Secondary | ICD-10-CM | POA: Diagnosis not present

## 2018-05-01 DIAGNOSIS — Z01419 Encounter for gynecological examination (general) (routine) without abnormal findings: Secondary | ICD-10-CM

## 2018-05-01 DIAGNOSIS — N8110 Cystocele, unspecified: Secondary | ICD-10-CM | POA: Diagnosis not present

## 2018-05-01 DIAGNOSIS — Z01411 Encounter for gynecological examination (general) (routine) with abnormal findings: Secondary | ICD-10-CM | POA: Diagnosis not present

## 2018-05-01 DIAGNOSIS — Z1212 Encounter for screening for malignant neoplasm of rectum: Secondary | ICD-10-CM

## 2018-05-01 LAB — HEMOCCULT GUIAC POC 1CARD (OFFICE): FECAL OCCULT BLD: NEGATIVE

## 2018-05-01 NOTE — Progress Notes (Signed)
Patient ID: Misty Wilson, female   DOB: [birth year], 77 y.o.   MRN: [id number] History of Present Illness: [name] is a 77 year old white female,in for well woman gyn exam, her last pap was normal with negative HPV 04/25/13.No more paps.  She is active and still works No PCP.    Current Medications, Allergies, Past Medical History, Past Surgical History, Family History and Social History were reviewed in [employer] record.     Review of Systems: Patient denies any headaches, hearing loss, fatigue, blurred vision, shortness of breath, chest pain, abdominal pain, problems with bowel movements, urination, or intercourse(not having sex).No joint pain or mood swings.    Physical Exam:BP 132/83 (BP Location: Left Arm, Patient Position: Sitting, Cuff Size: Normal)   Pulse (!) 59   Ht 5\' 5"  (1.651 m)   Wt 171 lb 8 oz (77.8 kg)   BMI 28.54 kg/m  General:  Well developed, well nourished, no acute distress Skin:  Warm and dry,+tattoos  Neck:  Midline trachea, normal thyroid, good ROM, no lymphadenopathy, no carotid bruits heard  Lungs; Clear to auscultation bilaterally Breast:  No dominant palpable mass, retraction, or nipple discharge,has bilateral breast implants, has, whelp from tick bite below left breast, happened about a week ago Cardiovascular: Regular rate and rhythm Abdomen:  Soft, non tender, no hepatosplenomegaly Pelvic:  External genitalia is normal in appearance, no lesions. Has clit ring. The vagina is normal in appearance,for age, pale, with loss of moisture and rugae, +cystocele. Urethra has no lesions or masses. The cervix is smooth.  Uterus is felt to be normal size, shape, and contour.  No adnexal masses or tenderness noted.Bladder is non tender, no masses felt. Rectal: Good sphincter tone, no polyps, or hemorrhoids felt.  Hemoccult negative. Extremities/musculoskeletal:  No swelling or varicosities noted, no clubbing or cyanosis Psych:  No mood  changes, alert and cooperative,seems happy PHQ 2 score 0.  Impression: 1. Encounter for well woman exam with routine gynecological exam   2. Screening for colorectal cancer   3. Cystocele without uterine prolapse   4. Elevated cholesterol   5. Postmenopausal       Plan: Check CMP and lipids Get mammogram DEXA scheduled for 05/10/18 at 8:30 am Physical in 2 years Colonoscopy in August 2019.

## 2018-05-02 LAB — COMPREHENSIVE METABOLIC PANEL
ALK PHOS: 74 IU/L (ref 39–117)
ALT: 16 IU/L (ref 0–32)
AST: 17 IU/L (ref 0–40)
Albumin/Globulin Ratio: 1.6 (ref 1.2–2.2)
Albumin: 4.4 g/dL (ref 3.5–4.8)
BILIRUBIN TOTAL: 0.3 mg/dL (ref 0.0–1.2)
BUN / CREAT RATIO: 13 (ref 12–28)
BUN: 9 mg/dL (ref 8–27)
CHLORIDE: 102 mmol/L (ref 96–106)
CO2: 24 mmol/L (ref 20–29)
Calcium: 9.8 mg/dL (ref 8.7–10.3)
Creatinine, Ser: 0.68 mg/dL (ref 0.57–1.00)
GFR calc Af Amer: 99 mL/min/{1.73_m2} (ref >59)
GFR calc non Af Amer: 86 mL/min/{1.73_m2} (ref >59)
GLOBULIN, TOTAL: 2.7 g/dL (ref 1.5–4.5)
GLUCOSE: 88 mg/dL (ref 65–99)
Potassium: 5.1 mmol/L (ref 3.5–5.2)
SODIUM: 140 mmol/L (ref 134–144)
Total Protein: 7.1 g/dL (ref 6.0–8.5)

## 2018-05-02 LAB — LIPID PANEL
CHOLESTEROL TOTAL: 188 mg/dL (ref 100–199)
Chol/HDL Ratio: 6.1 ratio — ABNORMAL HIGH (ref 0.0–4.4)
HDL: 31 mg/dL — ABNORMAL LOW (ref >39)
LDL Calculated: 121 mg/dL — ABNORMAL HIGH (ref 0–99)
Triglycerides: 181 mg/dL — ABNORMAL HIGH (ref 0–149)
VLDL Cholesterol Cal: 36 mg/dL (ref 5–40)

## 2018-05-10 ENCOUNTER — Ambulatory Visit (HOSPITAL_COMMUNITY)
Admission: RE | Admit: 2018-05-10 | Discharge: 2018-05-10 | Disposition: A | Discharge status: Home / Self Care | Payer: PPO | Source: Ambulatory Visit | Attending: Adult Health | Admitting: Adult Health

## 2018-05-10 ENCOUNTER — Other Ambulatory Visit: Payer: Self-pay | Admitting: Obstetrics and Gynecology

## 2018-05-10 DIAGNOSIS — Z780 Asymptomatic menopausal state: Secondary | ICD-10-CM | POA: Diagnosis not present

## 2018-05-10 DIAGNOSIS — Z1231 Encounter for screening mammogram for malignant neoplasm of breast: Secondary | ICD-10-CM

## 2018-05-10 DIAGNOSIS — Z13820 Encounter for screening for osteoporosis: Secondary | ICD-10-CM | POA: Diagnosis not present

## 2018-05-23 ENCOUNTER — Encounter (HOSPITAL_COMMUNITY): Payer: Self-pay

## 2018-05-23 ENCOUNTER — Ambulatory Visit (HOSPITAL_COMMUNITY)
Admission: RE | Admit: 2018-05-23 | Discharge: 2018-05-23 | Disposition: A | Discharge status: Home / Self Care | Payer: PPO | Source: Ambulatory Visit | Attending: Obstetrics and Gynecology | Admitting: Obstetrics and Gynecology

## 2018-05-23 ENCOUNTER — Ambulatory Visit (HOSPITAL_COMMUNITY): Payer: PPO

## 2018-05-23 DIAGNOSIS — Z1231 Encounter for screening mammogram for malignant neoplasm of breast: Secondary | ICD-10-CM

## 2018-06-05 ENCOUNTER — Telehealth: Payer: Self-pay

## 2018-06-05 NOTE — Telephone Encounter (Signed)
Pt called and cancelled her procedure for 05/31/18 with RMR- she has to go to court and she will call back to reschedule. Called and informed Kim in endo. Took pt off the schedule.

## 2018-06-08 DIAGNOSIS — H52223 Regular astigmatism, bilateral: Secondary | ICD-10-CM | POA: Diagnosis not present

## 2018-06-08 DIAGNOSIS — H18413 Arcus senilis, bilateral: Secondary | ICD-10-CM | POA: Diagnosis not present

## 2018-06-08 DIAGNOSIS — H0100B Unspecified blepharitis left eye, upper and lower eyelids: Secondary | ICD-10-CM | POA: Diagnosis not present

## 2018-06-08 DIAGNOSIS — H11153 Pinguecula, bilateral: Secondary | ICD-10-CM | POA: Diagnosis not present

## 2018-06-08 DIAGNOSIS — H0100A Unspecified blepharitis right eye, upper and lower eyelids: Secondary | ICD-10-CM | POA: Diagnosis not present

## 2018-06-08 DIAGNOSIS — H5202 Hypermetropia, left eye: Secondary | ICD-10-CM | POA: Diagnosis not present

## 2018-06-08 DIAGNOSIS — H524 Presbyopia: Secondary | ICD-10-CM | POA: Diagnosis not present

## 2018-06-08 DIAGNOSIS — H40013 Open angle with borderline findings, low risk, bilateral: Secondary | ICD-10-CM | POA: Diagnosis not present

## 2018-06-08 DIAGNOSIS — H2513 Age-related nuclear cataract, bilateral: Secondary | ICD-10-CM | POA: Diagnosis not present

## 2018-06-12 ENCOUNTER — Ambulatory Visit (HOSPITAL_COMMUNITY): Admit: 2018-06-12 | Payer: PPO | Admitting: Internal Medicine

## 2018-06-12 ENCOUNTER — Encounter (HOSPITAL_COMMUNITY): Payer: Self-pay

## 2018-06-12 SURGERY — COLONOSCOPY
Anesthesia: Moderate Sedation

## 2018-08-28 ENCOUNTER — Ambulatory Visit: Payer: PPO

## 2018-08-28 DIAGNOSIS — B029 Zoster without complications: Secondary | ICD-10-CM | POA: Diagnosis not present

## 2018-09-03 ENCOUNTER — Encounter (HOSPITAL_COMMUNITY): Payer: Self-pay

## 2018-09-03 ENCOUNTER — Emergency Department (HOSPITAL_COMMUNITY)
Admission: EM | Admit: 2018-09-03 | Discharge: 2018-09-03 | Disposition: A | Discharge status: Home / Self Care | Payer: PPO | Attending: Emergency Medicine | Admitting: Emergency Medicine

## 2018-09-03 ENCOUNTER — Other Ambulatory Visit: Payer: Self-pay

## 2018-09-03 DIAGNOSIS — I10 Essential (primary) hypertension: Secondary | ICD-10-CM | POA: Diagnosis not present

## 2018-09-03 DIAGNOSIS — Z720 Tobacco use: Secondary | ICD-10-CM | POA: Diagnosis not present

## 2018-09-03 DIAGNOSIS — Z79899 Other long term (current) drug therapy: Secondary | ICD-10-CM | POA: Insufficient documentation

## 2018-09-03 DIAGNOSIS — R55 Syncope and collapse: Secondary | ICD-10-CM | POA: Insufficient documentation

## 2018-09-03 DIAGNOSIS — R42 Dizziness and giddiness: Secondary | ICD-10-CM | POA: Diagnosis not present

## 2018-09-03 DIAGNOSIS — W19XXXA Unspecified fall, initial encounter: Secondary | ICD-10-CM | POA: Diagnosis not present

## 2018-09-03 LAB — BASIC METABOLIC PANEL
Anion gap: 7 (ref 5–15)
BUN: 17 mg/dL (ref 8–23)
CHLORIDE: 102 mmol/L (ref 98–111)
CO2: 29 mmol/L (ref 22–32)
Calcium: 9.7 mg/dL (ref 8.9–10.3)
Creatinine, Ser: 0.64 mg/dL (ref 0.44–1.00)
GFR calc Af Amer: >60 mL/min (ref >60)
Glucose, Bld: 108 mg/dL — ABNORMAL HIGH (ref 70–99)
POTASSIUM: 4.7 mmol/L (ref 3.5–5.1)
SODIUM: 138 mmol/L (ref 135–145)

## 2018-09-03 LAB — CBC
HCT: 39.8 % (ref 36.0–46.0)
HEMOGLOBIN: 12.6 g/dL (ref 12.0–15.0)
MCH: 29.2 pg (ref 26.0–34.0)
MCHC: 31.7 g/dL (ref 30.0–36.0)
MCV: 92.1 fL (ref 80.0–100.0)
NRBC: 0 % (ref 0.0–0.2)
PLATELETS: 297 10*3/uL (ref 150–400)
RBC: 4.32 MIL/uL (ref 3.87–5.11)
RDW: 13.2 % (ref 11.5–15.5)
WBC: 13.3 10*3/uL — ABNORMAL HIGH (ref 4.0–10.5)

## 2018-09-03 NOTE — ED Provider Notes (Signed)
[facility] EMERGENCY DEPARTMENT Provider Note   CSN: [id number] Arrival date & time: 09/03/18  1120     History   Chief Complaint Chief Complaint  Patient presents with  . Loss of Consciousness    HPI Misty Wilson is a 77 y.o. female.  HPI Patient presents to the emergency room for evaluation after a syncopal episode.  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  Patient states suddenly she felt the room was spinning on her and she felt lightheaded.  She had a brief syncopal episode but her son was there and lowered her to a chair.  When EMS arrived they noticed that her blood pressure was elevated.  Patient right now feels fine and has no complaints.  She does not feel lightheaded.  The room is not spinning.  She does not feel dizzy.  Denies any trouble with her speech.  No chest pain or shortness of breath.  No numbness or weakness Patient recently did see her doctor for what they thought was a recurrent shingles episode.  She did start taking steroids and one other medication.  She wonders if that could have been a contributing factor Past Medical History:  Diagnosis Date  . Breast mass, left 04/25/2013  . Multiple gastric ulcers     Patient Active Problem List   Diagnosis Date Noted  . History of colonic polyps   . Diverticulosis of colon without hemorrhage   . Breast mass, left 04/25/2013  . TIBIALIS TENDINITIS 04/27/2009  . FOOT PAIN, RIGHT 11/20/2008    Past Surgical History:  Procedure Laterality Date  . AUGMENTATION MAMMAPLASTY    . CHOLECYSTECTOMY    . COLONOSCOPY N/A 04/06/2015   Procedure: COLONOSCOPY;  Surgeon: Daneil Dolin, MD;  Location: [facility] SUITE;  Service: Endoscopy;  Laterality: N/A;  8:45  . EXCISION MORTON'S NEUROMA Left 12/09/2015   Procedure: EXCISION NEUROMA LEFT FOOT;  Surgeon: Caprice Beaver, DPM;  Location: [facility] ORS;  Service: Podiatry;  Laterality: Left;  . TUBAL LIGATION    . veins     had varicose veins done     OB History     Gravida  4   Para  4   Term      Preterm      AB      Living        SAB      TAB      Ectopic      Multiple      Live Births               Home Medications    Prior to Admission medications   Medication Sig Start Date End Date Taking? Authorizing Provider  CALCIUM PO Take 630 mg by mouth daily.   Yes [provider]  Cholecalciferol (VITAMIN D-3) 1000 UNITS CAPS Take 1 capsule by mouth daily.   Yes [provider]  Ginkgo Biloba (GINKOBA PO) Take 1 tablet by mouth daily.    Yes [provider]  ibuprofen (ADVIL,MOTRIN) 200 MG tablet Take 200 mg by mouth every 6 (six) hours as needed for moderate pain.   Yes [provider]  KRILL OIL PO Take 1 capsule by mouth daily.   Yes [provider]  Omega-3 Fatty Acids (FISH OIL PO) Take 1 capsule by mouth daily.   Yes [provider]  TURMERIC PO Take by mouth.   Yes [provider]  valACYclovir (VALTREX) 1000 MG tablet Take 1,000 mg  by mouth 3 (three) times daily.  08/28/18  Yes [provider]  vitamin B-12 (CYANOCOBALAMIN) 1000 MCG tablet Take 1,000 mcg by mouth daily.   Yes [provider]  Na Sulfate-K Sulfate-Mg Sulf (SUPREP BOWEL PREP KIT) 17.5-3.13-1.6 GM/177ML SOLN Take 1 kit by mouth as directed. Patient not taking: Reported on 05/01/2018 04/03/18   Annitta Needs, NP    Family History Family History  Problem Relation Age of Onset  . Cancer Mother        colon  . Other Mother        aneursym  . Cancer Brother        colon  . Cancer Sister        bone,breast    Social History Social History   Tobacco Use  . Smoking status: Light Tobacco Smoker    Packs/day: 0.10    Years: 12.00    Pack years: 1.20    Types: Cigarettes  . Smokeless tobacco: Never Used  . Tobacco comment: smokes 1 pack per week  Substance Use Topics  . Alcohol use: No  . Drug use: No     Allergies   Codeine and Other   Review of Systems Review  of Systems  All other systems reviewed and are negative.    Physical Exam Updated Vital Signs BP (!) 176/85   Pulse (!) 115   Temp 98.6 F (37 C) (Oral)   Resp 15   Ht 1.651 m (_0 )   Wt 77.6 kg   SpO2 (!) 73%   BMI 28.46 kg/m   Physical Exam  Constitutional: She is oriented to person, place, and time. She appears well-developed and well-nourished. No distress.  HENT:  Head: Normocephalic and atraumatic.  Right Ear: External ear normal.  Left Ear: External ear normal.  Mouth/Throat: Oropharynx is clear and moist.  Eyes: Conjunctivae are normal. Right eye exhibits no discharge. Left eye exhibits no discharge. No scleral icterus.  Neck: Neck supple. No tracheal deviation present.  Cardiovascular: Normal rate, regular rhythm and intact distal pulses.  Pulmonary/Chest: Effort normal and breath sounds normal. No stridor. No respiratory distress. She has no wheezes. She has no rales.  Abdominal: Soft. Bowel sounds are normal. She exhibits no distension. There is no tenderness. There is no rebound and no guarding.  Musculoskeletal: She exhibits no edema or tenderness.  Neurological: She is alert and oriented to person, place, and time. She has normal strength. No cranial nerve deficit (No facial droop, extraocular movements intact, tongue midline ) or sensory deficit. She exhibits normal muscle tone. She displays no seizure activity. Coordination normal.  No pronator drift bilateral upper extrem, able to hold both legs off bed for 5 seconds, sensation intact in all extremities, no visual field cuts, no left or right sided neglect, normal finger-nose exam bilaterally, no nystagmus noted   Skin: Skin is warm and dry. No rash noted.  Psychiatric: She has a normal mood and affect.  Nursing note and vitals reviewed.    ED Treatments / Results  Labs (all labs ordered are listed, but only abnormal results are displayed) Labs Reviewed  CBC - Abnormal; Notable for the following  components:      Result Value   WBC 13.3 (*)    All other components within normal limits  BASIC METABOLIC PANEL - Abnormal; Notable for the following components:   Glucose, Bld 108 (*)    All other components within normal limits    EKG EKG Interpretation  Date/Time:  Monday September 03 2018 11:26:20 EST Ventricular Rate:  65 PR Interval:    QRS Duration: 98 QT Interval:  430 QTC Calculation: 448 R Axis:   -31 Text Interpretation:  Sinus rhythm Left axis deviation Baseline wander in lead(s) V5 No old tracing to compare Confirmed by Dorie Rank [phone]) on 09/03/2018 11:32:49 AM   Radiology No results found.  Procedures Procedures (including critical care time)  Medications Ordered in ED Medications - No data to display   Initial Impression / Assessment and Plan / ED Course  I have reviewed the triage vital signs and the nursing notes.  Pertinent labs & imaging results that were available during my care of the patient were reviewed by me and considered in my medical decision making (see chart for details).   Patient presented to the emergency room for evaluation after a syncopal episode.  Patient symptoms have all resolved.  She denies any trouble with chest pain or shortness of breath.  She is not having any neurologic symptoms.  I doubt acute coronary syndrome, PE, acute vascular emergency or stroke TIA.  Patient's labs were notable for slight increase in white blood cell count but this is likely related to her steroid use.  Patient thinks some of her symptoms may be related to side effect from medications.  She does not have any rash that now to suggest she needs to continue the prednisone.  I will have her discontinue that medication.  Patient is feeling well and like to go home.  May have been related to a vasovagal episode. Warning signs and precautions were discussed.  I will have her follow-up with her primary care doctor this week to be rechecked.  Final Clinical  Impressions(s) / ED Diagnoses   Final diagnoses:  Syncope, unspecified syncope type    ED Discharge Orders    None       Dorie Rank, MD 09/03/18 1408

## 2018-09-03 NOTE — Discharge Instructions (Addendum)
Stop taking the prednisone, make sure to stay well-hydrated, follow-up with your doctor later this week to be rechecked, return to the emergency room for any recurrent episodes or difficulties with weakness numbness or your speech

## 2018-09-03 NOTE — ED Triage Notes (Signed)
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  Reports her son caught her and lowered her to a chair.  EMS reports pt's bp elevated.  190/100 initially then 207/92 enroute.  Pt on medications at this time for shingles.  Reports rash has dried up.

## 2018-09-04 ENCOUNTER — Ambulatory Visit: Payer: PPO | Admitting: Adult Health

## 2018-09-04 ENCOUNTER — Encounter: Payer: Self-pay | Admitting: Adult Health

## 2018-09-04 VITALS — BP 141/83 | HR 75 | Ht 65.0 in | Wt 172.0 lb

## 2018-09-04 DIAGNOSIS — R030 Elevated blood-pressure reading, without diagnosis of hypertension: Secondary | ICD-10-CM | POA: Diagnosis not present

## 2018-09-04 NOTE — Patient Instructions (Signed)
keep BP log Watch salt, and decrease caffeine  Go to ER if needed

## 2018-09-04 NOTE — Progress Notes (Signed)
  Subjective:     Patient ID: Misty Wilson, female   DOB: 07-20-1941, 77 y.o.   MRN: [id number]  HPI Misty Wilson is a 77 year old white female in for BP check.She was at work yesterday and felt lightheaded and dizzy and then fainted.BP was elevated 190/100 and was transported to ER at [facility].Labs and EKG good, except WBC slightly elevated.She was seen at urgent care 10/29 and was treated with valtrex and prednisone for shingles. She says she does not take meds well.   Review of Systems Had syncopal episode yesterday No dizziness/light headedness today Reviewed past medical,surgical, social and family history. Reviewed medications and allergies.     Objective:   Physical Exam BP (!) 141/83 (BP Location: Left Arm, Patient Position: Sitting, Cuff Size: Normal)   Pulse 75   Ht 5\' 5"  (1.651 m)   Wt 172 lb (78 kg)   BMI 28.62 kg/m  Skin warm and dry.  Lungs: clear to ausculation bilaterally. Cardiovascular: regular rate and rhythm. Has normal gail, good strength bilaterally in hands, CN 2-12 intact.  Will get her to keep BP log and F/u in 2 weeks.     Assessment:     1. Elevated BP without diagnosis of hypertension       Plan:     No more prednisone Decrease salt and caffeine Keep BP log F/U in 2 weeks with me  Go to ER if needed

## 2018-09-18 ENCOUNTER — Ambulatory Visit: Payer: PPO | Admitting: Adult Health

## 2018-09-18 ENCOUNTER — Encounter: Payer: Self-pay | Admitting: Adult Health

## 2018-09-18 VITALS — BP 152/90 | HR 64 | Ht 65.0 in | Wt 175.0 lb

## 2018-09-18 DIAGNOSIS — E782 Mixed hyperlipidemia: Secondary | ICD-10-CM | POA: Insufficient documentation

## 2018-09-18 DIAGNOSIS — R030 Elevated blood-pressure reading, without diagnosis of hypertension: Secondary | ICD-10-CM | POA: Diagnosis not present

## 2018-09-18 MED ORDER — HYDROCHLOROTHIAZIDE 12.5 MG PO CAPS
12.5000 mg | ORAL_CAPSULE | Freq: Every day | ORAL | 3 refills | Status: DC
Start: 2018-09-18 — End: 2018-11-01

## 2018-09-18 NOTE — Progress Notes (Signed)
  Subjective:     Patient ID: Misty Wilson, female   DOB: [birth year], 77 y.o.   MRN: [id number]  HPI Stela is a 77 year old white female in for BP check and review BP log. Has not had another episode of feeling faint and really high BP.   Review of Systems Denies any headaches,or dizziness Reviewed past medical,surgical, social and family history. Reviewed medications and allergies.     Objective:   Physical Exam BP (!) 152/90 (BP Location: Left Arm, Patient Position: Sitting, Cuff Size: Normal)   Pulse 64   Ht 5\' 5"  (1.651 m)   Wt 175 lb (79.4 kg)   BMI 29.12 kg/m    Skin warm and dry.  Lungs: clear to ausculation bilaterally. Cardiovascular: regular rate and rhythm. BP readings at home range 130/70 to 140/80. Will add Microzide and check labs, fasting.  Assessment:     1. Elevated BP without diagnosis of hypertension   2. Elevated cholesterol with elevated triglycerides       Plan:     Check lipid panel and CMP fasting Meds ordered this encounter  Medications  . hydrochlorothiazide (MICROZIDE) 12.5 MG capsule    Sig: Take 1 capsule (12.5 mg total) by mouth daily.    Dispense:  30 capsule    Refill:  3    Order Specific Question:   Supervising Provider    Answer:   Elonda Husky, LUTHER H [[id number]]  Keep BP log F/u in 6 weeks or sooner if needed

## 2018-10-30 ENCOUNTER — Ambulatory Visit: Payer: PPO | Admitting: Adult Health

## 2018-11-01 ENCOUNTER — Other Ambulatory Visit: Payer: Self-pay | Admitting: Adult Health

## 2018-11-07 ENCOUNTER — Ambulatory Visit: Payer: PPO | Admitting: Adult Health

## 2018-11-08 ENCOUNTER — Other Ambulatory Visit: Payer: Self-pay | Admitting: *Deleted

## 2018-11-08 MED ORDER — HYDROCHLOROTHIAZIDE 12.5 MG PO TABS: 12.5000 mg | ORAL_TABLET | Freq: Every day | ORAL | 3 refills | Status: DC

## 2019-01-15 DIAGNOSIS — Z85828 Personal history of other malignant neoplasm of skin: Secondary | ICD-10-CM | POA: Diagnosis not present

## 2019-01-15 DIAGNOSIS — L819 Disorder of pigmentation, unspecified: Secondary | ICD-10-CM | POA: Diagnosis not present

## 2019-01-15 DIAGNOSIS — B078 Other viral warts: Secondary | ICD-10-CM | POA: Diagnosis not present

## 2019-01-15 DIAGNOSIS — L570 Actinic keratosis: Secondary | ICD-10-CM | POA: Diagnosis not present

## 2019-01-15 DIAGNOSIS — D485 Neoplasm of uncertain behavior of skin: Secondary | ICD-10-CM | POA: Diagnosis not present

## 2019-10-28 DIAGNOSIS — H2513 Age-related nuclear cataract, bilateral: Secondary | ICD-10-CM | POA: Diagnosis not present

## 2020-02-26 ENCOUNTER — Telehealth: Payer: Self-pay | Admitting: Adult Health

## 2020-02-26 NOTE — Telephone Encounter (Signed)
Pt has appt on June 8th for physical but was suppose to have labs drawn last year but never did during Covid. Can an order be put in for her to have done a few days before I will go ahead and put on schedule.

## 2020-02-26 NOTE — Telephone Encounter (Signed)
Pt on labs schedule for 6/4, check CBC,CMP,  and lipids and A1c, has had elevated triglycerides and elevated bs

## 2020-03-19 ENCOUNTER — Other Ambulatory Visit: Payer: Self-pay

## 2020-03-19 ENCOUNTER — Ambulatory Visit
Admission: EM | Admit: 2020-03-19 | Discharge: 2020-03-19 | Disposition: A | Discharge status: Home / Self Care | Payer: PPO | Attending: Emergency Medicine | Admitting: Emergency Medicine

## 2020-03-19 ENCOUNTER — Encounter: Payer: Self-pay | Admitting: Emergency Medicine

## 2020-03-19 DIAGNOSIS — R42 Dizziness and giddiness: Secondary | ICD-10-CM

## 2020-03-19 DIAGNOSIS — R030 Elevated blood-pressure reading, without diagnosis of hypertension: Secondary | ICD-10-CM

## 2020-03-19 LAB — POCT FASTING CBG KUC MANUAL ENTRY: POCT Glucose (KUC): 102 mg/dL — AB (ref 70–99)

## 2020-03-19 MED ORDER — HYDROCHLOROTHIAZIDE 12.5 MG PO TABS: 12.5000 mg | ORAL_TABLET | Freq: Every day | ORAL | 1 refills | Status: DC

## 2020-03-19 NOTE — ED Provider Notes (Signed)
[place]   [id number] 03/19/20 Arrival Time: 0957  CC: BP check  SUBJECTIVE:  Misty Wilson is a 79 y.o. female who presents for blood pressure check  Has been on BP medication in the past.  Concerned blood pressure has been elevated.  States blood pressure on average is 130/80.  160/80 in office.  Has taken HCTZ in the past with adverse side effects.  Complains of lightheadedness x 1 day.  Denies trauma or injury.  Denies HA, vision changes, dizziness, chest pain, shortness of breath, numbness or tingling in extremities, abdominal pain, changes in bowel or bladder habits.     ROS: As per HPI.  All other pertinent ROS negative.     Past Medical History:  Diagnosis Date  . Breast mass, left 04/25/2013  . Multiple gastric ulcers    Past Surgical History:  Procedure Laterality Date  . AUGMENTATION MAMMAPLASTY    . CHOLECYSTECTOMY    . COLONOSCOPY N/A 04/06/2015   Procedure: COLONOSCOPY;  Surgeon: Daneil Dolin, MD;  Location: [facility];  Service: Endoscopy;  Laterality: N/A;  8:45  . EXCISION MORTON'S NEUROMA Left 12/09/2015   Procedure: EXCISION NEUROMA LEFT FOOT;  Surgeon: Caprice Beaver, DPM;  Location: [facility];  Service: Podiatry;  Laterality: Left;  . TUBAL LIGATION    . veins     had varicose veins done   Allergies  Allergen Reactions  . Codeine     "makes me really, really sick"   . Other Nausea And Vomiting    Can not take Mycin drugs cause terrible stomach spasms   No current facility-administered medications on file prior to encounter.   Current Outpatient Medications on File Prior to Encounter  Medication Sig Dispense Refill  . CALCIUM PO Take 630 mg by mouth daily.    . Cholecalciferol (VITAMIN D-3) 1000 UNITS CAPS Take 1 capsule by mouth daily.    . Ginkgo Biloba (GINKOBA PO) Take 1 tablet by mouth daily.     Marland Kitchen ibuprofen (ADVIL,MOTRIN) 200 MG tablet Take 200 mg by mouth every 6 (six) hours as needed for moderate pain.    Marland Kitchen KRILL OIL PO Take 1  capsule by mouth daily.    . Na Sulfate-K Sulfate-Mg Sulf (SUPREP BOWEL PREP KIT) 17.5-3.13-1.6 GM/177ML SOLN Take 1 kit by mouth as directed. (Patient not taking: Reported on 05/01/2018) 1 Bottle 0  . TURMERIC PO Take by mouth.    . vitamin B-12 (CYANOCOBALAMIN) 1000 MCG tablet Take 1,000 mcg by mouth daily.     Social History   Socioeconomic History  . Marital status: Divorced    Spouse name: Not on file  . Number of children: Not on file  . Years of education: Not on file  . Highest education level: Not on file  Occupational History  . Not on file  Tobacco Use  . Smoking status: Light Tobacco Smoker    Packs/day: 0.10    Years: 12.00    Pack years: 1.20    Types: Cigarettes  . Smokeless tobacco: Never Used  . Tobacco comment: smokes 1 pack per week  Substance and Sexual Activity  . Alcohol use: No  . Drug use: No  . Sexual activity: Not Currently    Birth control/protection: Post-menopausal  Other Topics Concern  . Not on file  Social History Narrative  . Not on file   Social Determinants of Health   Financial Resource Strain:   . Difficulty of Paying Living Expenses:   Food Insecurity:   .  Worried About Charity fundraiser in the Last Year:   . Arboriculturist in the Last Year:   Transportation Needs:   . Film/video editor (Medical):   Marland Kitchen Lack of Transportation (Non-Medical):   Physical Activity:   . Days of Exercise per Week:   . Minutes of Exercise per Session:   Stress:   . Feeling of Stress :   Social Connections:   . Frequency of Communication with Friends and Family:   . Frequency of Social Gatherings with Friends and Family:   . Attends Religious Services:   . Active Member of Clubs or Organizations:   . Attends Archivist Meetings:   Marland Kitchen Marital Status:   Intimate Partner Violence:   . Fear of Current or Ex-Partner:   . Emotionally Abused:   Marland Kitchen Physically Abused:   . Sexually Abused:    Family History  Problem Relation Age of Onset    . Cancer Mother        colon  . Other Mother        aneursym  . Cancer Brother        colon  . Cancer Sister        bone,breast    OBJECTIVE:  Vitals:   03/19/20 1010  BP: (!) 161/80  Pulse: 65  Resp: 18  Temp: 98.3 F (36.8 C)  TempSrc: Oral  SpO2: 98%    General appearance: alert; no distress Eyes: PERRLA; EOMI HENT: normocephalic; atraumatic Neck: supple with FROM Lungs: clear to auscultation bilaterally Heart: regular rate and rhythm.   Extremities: no edema; symmetrical with no gross deformities Skin: warm and dry Neurologic: CN 2-12 grossly intact; normal gait Psychological: alert and cooperative; normal mood and affect  Labs: Results for orders placed or performed during the hospital encounter of 03/19/20 (from the past 24 hour(s))  POCT CBG (manual entry)     Status: Abnormal   Collection Time: 03/19/20 10:18 AM  Result Value Ref Range   POCT Glucose (KUC) 102 (A) 70 - 99 mg/dL     ASSESSMENT & PLAN:  1. Elevated blood pressure reading   2. Lightheadedness     Meds ordered this encounter  Medications  . hydrochlorothiazide (HYDRODIURIL) 12.5 MG tablet    Sig: Take 1 tablet (12.5 mg total) by mouth daily.    Dispense:  30 tablet    Refill:  1    Order Specific Question:   Supervising Provider    Answer:   Raylene Everts [[id number]]    Please begin monitoring your blood pressure at home and keep a log Eat a well balanced diet of fruits, vegetables and lean meats.  Avoid foods high in fat and salt Drink water.  At least half your body weight in ounces Exercise for at least 30 minutes daily Blood pressure medication refilled Follow up with PCP for further evaluation and management Return or go to the ED if you have any new or worsening symptoms such as vision changes, fatigue, dizziness, chest pain, shortness of breath, nausea, swelling in your hands or feet, urinary symptoms, etc...  Reviewed expectations re: course of current medical issues.  Questions answered. Outlined signs and symptoms indicating need for more acute intervention. Patient verbalized understanding. After Visit Summary given.   Lestine Box, PA-C 03/19/20 1020

## 2020-03-19 NOTE — ED Triage Notes (Signed)
Pt here after feeling lightheaded this am; pt sts she thinks her BP is elevated; pt sts was on BP meds in past but was able to be taken off; pt sts has PCP appt in June

## 2020-03-19 NOTE — Discharge Instructions (Addendum)
Please begin monitoring your blood pressure at home and keep a log Eat a well balanced diet of fruits, vegetables and lean meats.  Avoid foods high in fat and salt Drink water.  At least half your body weight in ounces Exercise for at least 30 minutes daily Blood pressure medication refilled Follow up with PCP for further evaluation and management Return or go to the ED if you have any new or worsening symptoms such as vision changes, fatigue, dizziness, chest pain, shortness of breath, nausea, swelling in your hands or feet, urinary symptoms, etc..Marland Kitchen

## 2020-04-03 ENCOUNTER — Other Ambulatory Visit: Payer: PPO

## 2020-04-03 ENCOUNTER — Other Ambulatory Visit: Payer: Self-pay | Admitting: Adult Health

## 2020-04-03 DIAGNOSIS — R739 Hyperglycemia, unspecified: Secondary | ICD-10-CM

## 2020-04-03 DIAGNOSIS — E782 Mixed hyperlipidemia: Secondary | ICD-10-CM

## 2020-04-03 DIAGNOSIS — Z01419 Encounter for gynecological examination (general) (routine) without abnormal findings: Secondary | ICD-10-CM | POA: Diagnosis not present

## 2020-04-03 NOTE — Progress Notes (Signed)
Ck CBC,CMP,lipids and A1c

## 2020-04-04 LAB — COMPREHENSIVE METABOLIC PANEL
ALT: 19 IU/L (ref 0–32)
AST: 16 IU/L (ref 0–40)
Albumin/Globulin Ratio: 1.7 (ref 1.2–2.2)
Albumin: 4.3 g/dL (ref 3.7–4.7)
Alkaline Phosphatase: 64 IU/L (ref 48–121)
BUN/Creatinine Ratio: 23 (ref 12–28)
BUN: 16 mg/dL (ref 8–27)
Bilirubin Total: 0.4 mg/dL (ref 0.0–1.2)
CO2: 27 mmol/L (ref 20–29)
Calcium: 9.5 mg/dL (ref 8.7–10.3)
Chloride: 102 mmol/L (ref 96–106)
Creatinine, Ser: 0.69 mg/dL (ref 0.57–1.00)
GFR calc Af Amer: 97 mL/min/{1.73_m2} (ref >59)
GFR calc non Af Amer: 84 mL/min/{1.73_m2} (ref >59)
Globulin, Total: 2.5 g/dL (ref 1.5–4.5)
Glucose: 88 mg/dL (ref 65–99)
Potassium: 4.7 mmol/L (ref 3.5–5.2)
Sodium: 140 mmol/L (ref 134–144)
Total Protein: 6.8 g/dL (ref 6.0–8.5)

## 2020-04-04 LAB — CBC
Hematocrit: 40.1 % (ref 34.0–46.6)
Hemoglobin: 13 g/dL (ref 11.1–15.9)
MCH: 29.5 pg (ref 26.6–33.0)
MCHC: 32.4 g/dL (ref 31.5–35.7)
MCV: 91 fL (ref 79–97)
Platelets: 250 10*3/uL (ref 150–450)
RBC: 4.4 x10E6/uL (ref 3.77–5.28)
RDW: 12.7 % (ref 11.7–15.4)
WBC: 6.6 10*3/uL (ref 3.4–10.8)

## 2020-04-04 LAB — LIPID PANEL
Chol/HDL Ratio: 5.2 ratio — ABNORMAL HIGH (ref 0.0–4.4)
Cholesterol, Total: 182 mg/dL (ref 100–199)
HDL: 35 mg/dL — ABNORMAL LOW (ref >39)
LDL Chol Calc (NIH): 132 mg/dL — ABNORMAL HIGH (ref 0–99)
Triglycerides: 83 mg/dL (ref 0–149)
VLDL Cholesterol Cal: 15 mg/dL (ref 5–40)

## 2020-04-04 LAB — HEMOGLOBIN A1C
Est. average glucose Bld gHb Est-mCnc: 120 mg/dL
Hgb A1c MFr Bld: 5.8 % — ABNORMAL HIGH (ref 4.8–5.6)

## 2020-04-07 ENCOUNTER — Other Ambulatory Visit (HOSPITAL_COMMUNITY)
Admission: RE | Admit: 2020-04-07 | Discharge: 2020-04-07 | Disposition: A | Discharge status: Home / Self Care | Payer: PPO | Source: Ambulatory Visit | Attending: Adult Health | Admitting: Adult Health

## 2020-04-07 ENCOUNTER — Encounter: Payer: Self-pay | Admitting: Internal Medicine

## 2020-04-07 ENCOUNTER — Ambulatory Visit (INDEPENDENT_AMBULATORY_CARE_PROVIDER_SITE_OTHER): Payer: PPO | Admitting: Adult Health

## 2020-04-07 ENCOUNTER — Encounter: Payer: Self-pay | Admitting: Adult Health

## 2020-04-07 VITALS — BP 122/82 | HR 78 | Ht 63.0 in | Wt 156.0 lb

## 2020-04-07 DIAGNOSIS — Z780 Asymptomatic menopausal state: Secondary | ICD-10-CM | POA: Insufficient documentation

## 2020-04-07 DIAGNOSIS — Z1151 Encounter for screening for human papillomavirus (HPV): Secondary | ICD-10-CM | POA: Insufficient documentation

## 2020-04-07 DIAGNOSIS — Z01419 Encounter for gynecological examination (general) (routine) without abnormal findings: Secondary | ICD-10-CM | POA: Insufficient documentation

## 2020-04-07 DIAGNOSIS — N814 Uterovaginal prolapse, unspecified: Secondary | ICD-10-CM | POA: Diagnosis not present

## 2020-04-07 DIAGNOSIS — Z1212 Encounter for screening for malignant neoplasm of rectum: Secondary | ICD-10-CM | POA: Insufficient documentation

## 2020-04-07 DIAGNOSIS — Z4689 Encounter for fitting and adjustment of other specified devices: Secondary | ICD-10-CM | POA: Insufficient documentation

## 2020-04-07 DIAGNOSIS — R87610 Atypical squamous cells of undetermined significance on cytologic smear of cervix (ASC-US): Secondary | ICD-10-CM | POA: Insufficient documentation

## 2020-04-07 LAB — HEMOCCULT GUIAC POC 1CARD (OFFICE): Fecal Occult Blood, POC: NEGATIVE

## 2020-04-07 NOTE — Progress Notes (Signed)
Patient ID: Misty Wilson, female   DOB: 1941-01-22, 79 y.o.   MRN: [id number] History of Present Illness: Misty Wilson is a 79 year old white female, divorced, PM in for a well woman gyn exam and pap.  She thinks bladder has dropped. She has lost 20 lbs in last few months, on low carb diet, and she still works 6 days a week.   Current Medications, Allergies, Past Medical History, Past Surgical History, Family History and Social History were reviewed in [employer] record.     Review of Systems: Patient denies any headaches, hearing loss, fatigue, blurred vision, shortness of breath, chest pain, abdominal pain, problems with bowel movements, urination, or intercourse(not active). No joint pain or mood swings. +bladder dropped.   Physical Exam:BP 122/82 (BP Location: Left Arm, Patient Position: Sitting, Cuff Size: Normal)   Pulse 78   Ht 5\' 3"  (1.6 m)   Wt 156 lb (70.8 kg)   BMI 27.63 kg/m  General:  Well developed, well nourished, no acute distress Skin:  Warm and dry,she has tattoos Neck:  Midline trachea, normal thyroid, good ROM, no lymphadenopathy,no carotid bruits heard Lungs; Clear to auscultation bilaterally Breast:  No dominant palpable mass, retraction, or nipple discharge,has bilateral implants Cardiovascular: Regular rate and rhythm Abdomen:  Soft, non tender, no hepatosplenomegaly Pelvic:  External genitalia is normal in appearance, no lesions.  The vagina is pale pink with loss of moisture and rugae, +cystocele and uterine prolapse to jsut past introitus with coughing or bearing down,has clit ring. Urethra has no lesions or masses. The cervix is bulbous and smooth, pap with high risk HPV 16/18 genotyping performed.  Uterus is felt to be normal size, shape, and contour.  No adnexal masses or tenderness noted.Bladder is non tender, no masses felt. Rectal: Good sphincter tone, no polyps, or hemorrhoids felt.  Hemoccult  negative. Extremities/musculoskeletal:  No swelling or varicosities noted, no clubbing or cyanosis Psych:  No mood changes, alert and cooperative,seems happy AA 1 Fall risk is low PHQ 9 score is 0. Reviewed labs with her, they look good.  Examination chaperoned by Misty Pupa LPN  Impression and Plan: 1. Encounter for gynecological examination with Papanicolaou smear of cervix Pap sent, if normal, last pap Physical in 2 years Get mammogram this year   2. Screening for colorectal cancer Referred to Misty Misty Wilson for colonoscopy she says it is due  3. Cystocele with uterine prolapse Reviewed medical explaniner #4 with her and discussed pessaries, showed her some Return next week for pessary fitting with Misty Wilson

## 2020-04-13 LAB — CYTOLOGY - PAP
Comment: NEGATIVE
Diagnosis: UNDETERMINED — AB
High risk HPV: NEGATIVE

## 2020-04-15 ENCOUNTER — Encounter: Payer: Self-pay | Admitting: Obstetrics and Gynecology

## 2020-04-15 ENCOUNTER — Ambulatory Visit: Payer: PPO | Admitting: Obstetrics and Gynecology

## 2020-04-15 VITALS — BP 130/74 | HR 60 | Ht 64.0 in | Wt 157.0 lb

## 2020-04-15 DIAGNOSIS — N814 Uterovaginal prolapse, unspecified: Secondary | ICD-10-CM | POA: Diagnosis not present

## 2020-04-15 DIAGNOSIS — Z4689 Encounter for fitting and adjustment of other specified devices: Secondary | ICD-10-CM

## 2020-04-15 NOTE — Progress Notes (Signed)
Patient ID: Misty Wilson, female   DOB: 02/02/1941, 79 y.o.   MRN: [id number]  GYNECOLOGY CLINIC PROGRESS NOTE  The patient presented today for a pessary fitting. She reports no vaginal bleeding or discharge. She denies pelvic discomfort and difficulty urinating or moving her bowels. The patient has four children.  Speculum examination revealed normal vaginal mucosa with no lesions or lacerations.  The patient was fitted with a number 3 Gelhorn pessary.to be left in overnight  F/U tomorrow for pessary check.  By signing my name below, I, De Burrs, attest that this documentation has been prepared under the direction and in the presence of Jonnie Kind, MD. Electronically Signed: De Burrs, Medical Scribe. 04/15/20. 11:43 AM.  I personally performed the services described in this documentation, which was SCRIBED in my presence. The recorded information has been reviewed and considered accurate. It has been edited as necessary during review. Jonnie Kind, MD

## 2020-04-16 ENCOUNTER — Encounter: Payer: Self-pay | Admitting: Obstetrics and Gynecology

## 2020-04-16 ENCOUNTER — Ambulatory Visit: Payer: PPO | Admitting: Obstetrics and Gynecology

## 2020-04-16 VITALS — BP 128/82 | HR 70 | Ht 64.0 in | Wt 157.0 lb

## 2020-04-16 DIAGNOSIS — N814 Uterovaginal prolapse, unspecified: Secondary | ICD-10-CM

## 2020-04-16 DIAGNOSIS — Z466 Encounter for fitting and adjustment of urinary device: Secondary | ICD-10-CM | POA: Diagnosis not present

## 2020-04-16 NOTE — Progress Notes (Signed)
GYNECOLOGY CLINIC PROGRESS NOTE  The patient presented today for a pessary check. She reports no vaginal bleeding or discharge. She denies pelvic discomfort and difficulty urinating or moving her bowels. ?The patient's 2.25 number 3 Gelhorn pessary was removed, cleaned and replaced without complications. Speculum examination revealed normal vaginal mucosa with no lesions or lacerations. The patient should return in 3 months for a pessary check and continue to use vaginal estrogen cream weekly as prescribed.  We will place a pessary order for her at [facility].    By signing my name below, I, [employer], attest that this documentation has been prepared under the direction and in the presence of Jonnie Kind, MD. Electronically Signed: [place]. 04/16/20. 8:42 AM.  I personally performed the services described in this documentation, which was SCRIBED in my presence. The recorded information has been reviewed and considered accurate. It has been edited as necessary during review. Jonnie Kind, MD

## 2020-04-27 ENCOUNTER — Telehealth: Payer: Self-pay | Admitting: Obstetrics and Gynecology

## 2020-04-27 NOTE — Telephone Encounter (Signed)
Patient has not ordered her pessary replacement for the office yet.  She was reminded that important that we get a replacement pessary as per our agreement when she kept our office pessary

## 2020-05-19 ENCOUNTER — Telehealth: Payer: Self-pay | Admitting: Adult Health

## 2020-05-19 ENCOUNTER — Other Ambulatory Visit: Payer: Self-pay | Admitting: Obstetrics and Gynecology

## 2020-05-19 ENCOUNTER — Other Ambulatory Visit: Payer: Self-pay | Admitting: Adult Health

## 2020-05-19 MED ORDER — HYDROCHLOROTHIAZIDE 12.5 MG PO TABS: 12.5000 mg | ORAL_TABLET | Freq: Every day | ORAL | 1 refills | Status: DC

## 2020-05-19 NOTE — Telephone Encounter (Signed)
Pt needs refill sent in on her hctz. Urgent care gave her a couple of refills back in May. Now is needing to have it filled again. Pt aware to check with pharmacy. No other questions or concerns at this time.

## 2020-05-19 NOTE — Addendum Note (Signed)
Addended by: Derrek Monaco A on: 05/19/2020 05:00 PM   Modules accepted: Orders

## 2020-05-19 NOTE — Telephone Encounter (Signed)
Patient called in saying Anderson Malta prescribed her some meds last year and she want to see if she can get a refill on it.

## 2020-05-19 NOTE — Telephone Encounter (Signed)
Refilled microzide

## 2020-05-27 ENCOUNTER — Ambulatory Visit: Payer: PPO | Admitting: Gastroenterology

## 2020-07-17 ENCOUNTER — Ambulatory Visit: Payer: PPO | Admitting: Obstetrics and Gynecology

## 2020-07-21 ENCOUNTER — Other Ambulatory Visit: Payer: Self-pay

## 2020-07-21 ENCOUNTER — Ambulatory Visit: Payer: PPO | Admitting: Gastroenterology

## 2020-07-21 ENCOUNTER — Encounter: Payer: Self-pay | Admitting: Gastroenterology

## 2020-07-21 ENCOUNTER — Telehealth: Payer: Self-pay | Admitting: *Deleted

## 2020-07-21 VITALS — BP 141/85 | HR 75 | Temp 97.0°F | Ht 64.0 in | Wt 156.0 lb

## 2020-07-21 DIAGNOSIS — Z86010 Personal history of colonic polyps: Secondary | ICD-10-CM | POA: Diagnosis not present

## 2020-07-21 DIAGNOSIS — Z800 Family history of malignant neoplasm of digestive organs: Secondary | ICD-10-CM | POA: Diagnosis not present

## 2020-07-21 NOTE — Progress Notes (Signed)
Primary Care Physician: Patient, No Pcp Per Referring provider: Derrek Monaco, NP Primary Gastroenterologist:  Garfield Cornea, MD   Chief Complaint  Patient presents with  . Consult    TCS last done 2016    HPI: Misty Wilson is a 79 y.o. female here for consideration of surveillance colonoscopy at the request of Derrek Monaco, NP  Patient's last colonoscopy was in 2016, she had several polyps removed from the rectum, some were tubular adenomas.  Based on the number and size, she is recommended to have a 3-year repeat colonoscopy.  She canceled her procedure in 2019 because of court obligations. Last year she had Shingles and never rescheduled.   She denies abdominal pain, constipation, diarrhea, melena, rectal bleeding, heartburn.  Continues to work 40+ hours per week at [employer].  Stays active.  Because of family history of colon cancer and her personal history of colon polyps she desires another colonoscopy at this time. 26 pounds intentional weight loss by cutting back on carbs.      Current Outpatient Medications  Medication Sig Dispense Refill  . Ascorbic Acid (VITAMIN C PO) Take by mouth.    Marland Kitchen CALCIUM PO Take 630 mg by mouth daily.    . Cholecalciferol (VITAMIN D-3) 1000 UNITS CAPS Take 1 capsule by mouth daily.    . Ginkgo Biloba (GINKOBA PO) Take 1 tablet by mouth daily.     Marland Kitchen ibuprofen (ADVIL,MOTRIN) 200 MG tablet Take 200 mg by mouth every 6 (six) hours as needed for moderate pain.    Marland Kitchen KRILL OIL PO Take 1 capsule by mouth daily.    . TURMERIC PO Take by mouth.    . vitamin B-12 (CYANOCOBALAMIN) 1000 MCG tablet Take 1,000 mcg by mouth daily.     No current facility-administered medications for this visit.    Allergies as of 07/21/2020 - Review Complete 07/21/2020  Allergen Reaction Noted  . Codeine    . Other Nausea And Vomiting 11/24/2015   Past Medical History:  Diagnosis Date  . Breast mass, left 04/25/2013  . Multiple gastric ulcers     Past Surgical History:  Procedure Laterality Date  . AUGMENTATION MAMMAPLASTY    . CHOLECYSTECTOMY    . COLONOSCOPY N/A 04/06/2015   Procedure: COLONOSCOPY;  Surgeon: Daneil Dolin, MD;  Location: [facility];  Service: Endoscopy;  Laterality: N/A;  8:45  . EXCISION MORTON'S NEUROMA Left 12/09/2015   Procedure: EXCISION NEUROMA LEFT FOOT;  Surgeon: Caprice Beaver, DPM;  Location: [facility];  Service: Podiatry;  Laterality: Left;  . TUBAL LIGATION    . veins     had varicose veins done   Family History  Problem Relation Age of Onset  . Cancer Mother        colon  . Other Mother        aneursym  . Cancer Brother        colon  . Cancer Sister        bone,breast   Social History   Tobacco Use  . Smoking status: Light Tobacco Smoker    Packs/day: 0.10    Years: 12.00    Pack years: 1.20    Types: Cigarettes  . Smokeless tobacco: Never Used  . Tobacco comment: smokes 1 pack per week  Vaping Use  . Vaping Use: Never used  Substance Use Topics  . Alcohol use: No  . Drug use: No    ROS:  General: Negative for anorexia, unintentional  weight loss, fever, chills, fatigue, weakness. ENT: Negative for hoarseness, difficulty swallowing , nasal congestion. CV: Negative for chest pain, angina, palpitations, dyspnea on exertion, peripheral edema.  Respiratory: Negative for dyspnea at rest, dyspnea on exertion, cough, sputum, wheezing.  GI: See history of present illness. GU:  Negative for dysuria, hematuria, urinary incontinence, urinary frequency, nocturnal urination.  Endo: Negative for unusual weight change.    Physical Examination:   BP (!) 141/85   Pulse 75   Temp (!) 97 F (36.1 C) (Oral)   Ht 5\' 4"  (1.626 m)   Wt 156 lb (70.8 kg)   BMI 26.78 kg/m   General: Well-nourished, well-developed in no acute distress.  Eyes: No icterus. Mouth: masked Lungs: Clear to auscultation bilaterally.  Heart: Regular rate and rhythm, no murmurs rubs or gallops.  Abdomen: Bowel  sounds are normal, nontender, nondistended, no hepatosplenomegaly or masses, no abdominal bruits or hernia , no rebound or guarding.   Extremities: No lower extremity edema. No clubbing or deformities. Neuro: Alert and oriented x 4   Skin: Warm and dry, no jaundice.   Psych: Alert and cooperative, normal mood and affect.  Labs:  Lab Results  Component Value Date   CREATININE 0.69 04/03/2020   BUN 16 04/03/2020   NA 140 04/03/2020   K 4.7 04/03/2020   CL 102 04/03/2020   CO2 27 04/03/2020   Lab Results  Component Value Date   WBC 6.6 04/03/2020   HGB 13.0 04/03/2020   HCT 40.1 04/03/2020   MCV 91 04/03/2020   PLT 250 04/03/2020   Lab Results  Component Value Date   ALT 19 04/03/2020   AST 16 04/03/2020   ALKPHOS 64 04/03/2020   BILITOT 0.4 04/03/2020     Imaging Studies: No results found.   Impression/plan:  79 year old female with family history of colon cancer, personal history of adenomatous colon polyps, was due for 3-year surveillance colonoscopy back in 2019, returning to schedule procedure at this time.  Denies any GI symptoms.  She continues to enjoy good health.  We discussed guideline updates, potentially holding off on further screening/surveillance due to age but because of her significant family history, personal history of numerous adenomatous colon polyps, she desires additional colonoscopy which is reasonable approach.  Plan for colonoscopy in the near future with Dr. Gala Romney, conscious sedation. ASA II.  I have discussed the risks, alternatives, benefits with regards to but not limited to the risk of reaction to medication, bleeding, infection, perforation and the patient is agreeable to proceed. Written consent to be obtained.

## 2020-07-21 NOTE — Patient Instructions (Signed)
1. Colonoscopy to be scheduled. See separate instructions.  

## 2020-07-21 NOTE — Progress Notes (Signed)
NO PCP PER PATIENT °

## 2020-07-21 NOTE — Telephone Encounter (Signed)
Called pt. She has been scheduled for TCS with Dr. Gala Romney on 11/3 at 1:00pm. Patient aware will need covid test prior and will mail with prep instructions.

## 2020-07-23 ENCOUNTER — Ambulatory Visit: Payer: PPO | Admitting: Obstetrics and Gynecology

## 2020-07-23 ENCOUNTER — Encounter: Payer: Self-pay | Admitting: Obstetrics and Gynecology

## 2020-07-23 VITALS — BP 147/79 | HR 65 | Ht 64.5 in | Wt 157.4 lb

## 2020-07-23 DIAGNOSIS — Z4689 Encounter for fitting and adjustment of other specified devices: Secondary | ICD-10-CM | POA: Diagnosis not present

## 2020-07-23 NOTE — Progress Notes (Signed)
GYNECOLOGY CLINIC PROGRESS NOTE  The patient presented today for a pessary check. She reports no vaginal bleeding or discharge. She denies pelvic discomfort and difficulty urinating or moving her bowels. ?The patient's gelhorn pessary was found  To be in good position without complications. Speculum examination revealed normal vaginal mucosa with no lesions or lacerations. The patient should return in 6 months for a pessary check and continue to use vaginal estrogen cream weekly as prescribed.

## 2020-09-01 ENCOUNTER — Other Ambulatory Visit: Payer: Self-pay

## 2020-09-01 ENCOUNTER — Other Ambulatory Visit (HOSPITAL_COMMUNITY)
Admission: RE | Admit: 2020-09-01 | Discharge: 2020-09-01 | Disposition: A | Discharge status: Home / Self Care | Payer: PPO | Source: Ambulatory Visit | Attending: Internal Medicine | Admitting: Internal Medicine

## 2020-09-01 DIAGNOSIS — Z20822 Contact with and (suspected) exposure to covid-19: Secondary | ICD-10-CM | POA: Diagnosis not present

## 2020-09-01 DIAGNOSIS — Z01812 Encounter for preprocedural laboratory examination: Secondary | ICD-10-CM | POA: Diagnosis not present

## 2020-09-01 LAB — SARS CORONAVIRUS 2 (TAT 6-24 HRS): SARS Coronavirus 2: NEGATIVE

## 2020-09-02 ENCOUNTER — Encounter (HOSPITAL_COMMUNITY)
Admission: RE | Disposition: A | Discharge status: Home / Self Care | Payer: Self-pay | Source: Home / Self Care | Attending: Internal Medicine

## 2020-09-02 ENCOUNTER — Ambulatory Visit (HOSPITAL_COMMUNITY)
Admission: RE | Admit: 2020-09-02 | Discharge: 2020-09-02 | Disposition: A | Discharge status: Home / Self Care | Payer: PPO | Attending: Internal Medicine | Admitting: Internal Medicine

## 2020-09-02 ENCOUNTER — Other Ambulatory Visit: Payer: Self-pay

## 2020-09-02 DIAGNOSIS — K635 Polyp of colon: Secondary | ICD-10-CM | POA: Diagnosis not present

## 2020-09-02 DIAGNOSIS — Z885 Allergy status to narcotic agent status: Secondary | ICD-10-CM | POA: Insufficient documentation

## 2020-09-02 DIAGNOSIS — Z86010 Personal history of colonic polyps: Secondary | ICD-10-CM | POA: Insufficient documentation

## 2020-09-02 DIAGNOSIS — D122 Benign neoplasm of ascending colon: Secondary | ICD-10-CM | POA: Diagnosis not present

## 2020-09-02 DIAGNOSIS — Z1211 Encounter for screening for malignant neoplasm of colon: Secondary | ICD-10-CM | POA: Insufficient documentation

## 2020-09-02 DIAGNOSIS — F17210 Nicotine dependence, cigarettes, uncomplicated: Secondary | ICD-10-CM | POA: Diagnosis not present

## 2020-09-02 DIAGNOSIS — K5730 Diverticulosis of large intestine without perforation or abscess without bleeding: Secondary | ICD-10-CM | POA: Insufficient documentation

## 2020-09-02 HISTORY — PX: POLYPECTOMY: SHX5525

## 2020-09-02 HISTORY — PX: COLONOSCOPY: SHX5424

## 2020-09-02 SURGERY — COLONOSCOPY
Anesthesia: Moderate Sedation

## 2020-09-02 MED ORDER — ONDANSETRON HCL 4 MG/2ML IJ SOLN
INTRAMUSCULAR | Status: AC
  Filled 2020-09-02: qty 2

## 2020-09-02 MED ORDER — MIDAZOLAM HCL 5 MG/5ML IJ SOLN
INTRAMUSCULAR | Status: DC | PRN
  Administered 2020-09-02 (×2): 2 mg via INTRAVENOUS
  Administered 2020-09-02: 1 mg via INTRAVENOUS

## 2020-09-02 MED ORDER — MEPERIDINE HCL 100 MG/ML IJ SOLN
INTRAMUSCULAR | Status: DC | PRN
Start: 2020-09-02 — End: 2020-09-02
  Administered 2020-09-02: 25 mg via INTRAVENOUS
  Administered 2020-09-02: 10 mg via INTRAVENOUS
  Administered 2020-09-02: 15 mg via INTRAVENOUS

## 2020-09-02 MED ORDER — STERILE WATER FOR IRRIGATION IR SOLN
Status: DC | PRN
  Administered 2020-09-02: 1.5 mL

## 2020-09-02 MED ORDER — SODIUM CHLORIDE 0.9 % IV SOLN: INTRAVENOUS | Status: DC

## 2020-09-02 MED ORDER — MIDAZOLAM HCL 5 MG/5ML IJ SOLN
INTRAMUSCULAR | Status: AC
  Filled 2020-09-02: qty 10

## 2020-09-02 MED ORDER — ONDANSETRON HCL 4 MG/2ML IJ SOLN
INTRAMUSCULAR | Status: DC | PRN
  Administered 2020-09-02: 4 mg via INTRAVENOUS

## 2020-09-02 MED ORDER — MEPERIDINE HCL 50 MG/ML IJ SOLN
INTRAMUSCULAR | Status: AC
  Filled 2020-09-02: qty 1

## 2020-09-02 NOTE — Op Note (Signed)
[facility] Patient Name: Misty Wilson Procedure Date: 09/02/2020 11:52 AM MRN: [id number] Date of Birth: 11/18/1940 Attending MD: Norvel Richards , MD CSN: [id number] Age: 79 Admit Type: Outpatient Procedure:                Colonoscopy Indications:              High risk colon cancer surveillance: Personal                            history of colonic polyps Providers:                Norvel Richards, MD, Otis Peak B. Sharon Seller, RN,                            Raphael Gibney, Technician Referring MD:              Medicines:                Midazolam 5 mg IV, Meperidine 50 mg IV Complications:            No immediate complications. Estimated Blood Loss:     Estimated blood loss was minimal. Procedure:                Pre-Anesthesia Assessment:                           - Prior to the procedure, a History and Physical                            was performed, and patient medications and                            allergies were reviewed. The patient's tolerance of                            previous anesthesia was also reviewed. The risks                            and benefits of the procedure and the sedation                            options and risks were discussed with the patient.                            All questions were answered, and informed consent                            was obtained. Prior Anticoagulants: The patient has                            taken no previous anticoagulant or antiplatelet                            agents. ASA Grade Assessment: III - A patient with  severe systemic disease. After reviewing the risks                            and benefits, the patient was deemed in                            satisfactory condition to undergo the procedure.                           After obtaining informed consent, the colonoscope                            was passed under direct vision. Throughout the                             procedure, the patient's blood pressure, pulse, and                            oxygen saturations were monitored continuously. The                            CF-[id number] ([id number]) scope was introduced through                            the anus and advanced to the the cecum, identified                            by appendiceal orifice and ileocecal valve. The                            colonoscopy was performed without difficulty. The                            patient tolerated the procedure well. The entire                            colon was well visualized. Scope In: 12:22:28 PM Scope Out: 12:42:34 PM Scope Withdrawal Time: 0 hours 9 minutes 10 seconds  Total Procedure Duration: 0 hours 20 minutes 6 seconds  Findings:      The perianal and digital rectal examinations were normal.      Scattered medium-mouthed diverticula were found in the sigmoid colon and       descending colon.      A 7 mm polyp was found in the ascending colon. The polyp was sessile.       The polyp was removed with a cold snare. Resection and retrieval were       complete. Estimated blood loss was minimal.      The exam was otherwise without abnormality on direct and retroflexion       views. Impression:               - Diverticulosis in the sigmoid colon and in the                            descending colon.                           -  One 7 mm polyp in the ascending colon, removed                            with a cold snare. Resected and retrieved.                           - The examination was otherwise normal on direct                            and retroflexion views. Moderate Sedation:      Moderate (conscious) sedation was administered by the endoscopy nurse       and supervised by the endoscopist. The following parameters were       monitored: oxygen saturation, heart rate, blood pressure, respiratory       rate, EKG, adequacy of pulmonary ventilation, and response to care. Recommendation:            - Patient has a contact number available for                            emergencies. The signs and symptoms of potential                            delayed complications were discussed with the                            patient. Return to normal activities tomorrow.                            Written discharge instructions were provided to the                            patient.                           - Advance diet as tolerated.                           - Repeat colonoscopy date to be determined after                            pending pathology results are reviewed for                            surveillance based on pathology results.                           - Return to GI office (date not yet determined). Procedure Code(s):        --- Professional ---                           [phone], Colonoscopy, flexible; with removal of                            tumor(s), polyp(s), or other lesion(s) by snare  technique Diagnosis Code(s):        --- Professional ---                           Z86.010, Personal history of colonic polyps                           K63.5, Polyp of colon                           K57.30, Diverticulosis of large intestine without                            perforation or abscess without bleeding CPT copyright 2019 American Medical Association. All rights reserved. The codes documented in this report are preliminary and upon coder review may  be revised to meet current compliance requirements. Cristopher Estimable. Daisa Stennis, MD Norvel Richards, MD 09/02/2020 12:49:46 PM This report has been signed electronically. Number of Addenda: 0

## 2020-09-02 NOTE — H&P (Signed)
@LOGO @   Primary Care Physician:  Patient, No Pcp Per Primary Gastroenterologist:  Dr. Gala Romney  Pre-Procedure History & Physical: HPI:  Misty Wilson is a 79 y.o. female here for surveillance colonoscopy.  History of multiple colonic adenomas removed 2016.  Desires 1 more colonoscopy.  Past Medical History:  Diagnosis Date  . Breast mass, left 04/25/2013  . Multiple gastric ulcers     Past Surgical History:  Procedure Laterality Date  . AUGMENTATION MAMMAPLASTY    . CHOLECYSTECTOMY    . COLONOSCOPY N/A 04/06/2015   Procedure: COLONOSCOPY;  Surgeon: Daneil Dolin, MD;  Location: [facility] END[facility] SUITE;  Service: Endoscopy;  Laterality: N/A;  8:45  . EXCISION MORTON'S NEUROMA Left 12/09/2015   Procedure: EXCISION NEUROMA LEFT FOOT;  Surgeon: Caprice Beaver, DPM;  Location: [facility] ORS;  Service: Podiatry;  Laterality: Left;  . TUBAL LIGATION    . veins     had varicose veins done    Prior to Admission medications   Medication Sig Start Date End Date Taking? Authorizing Provider  Ascorbic Acid (VITAMIN C) 1000 MG tablet Take 2,000 mg by mouth daily.   Yes [provider]  aspirin EC 81 MG tablet Take 81 mg by mouth every evening. Swallow whole.   Yes [provider]  Calcium-Magnesium-Vitamin D (CALCIUM 1200+D3 PO) Take 1 tablet by mouth daily.   Yes [provider]  cholecalciferol (VITAMIN D) 25 MCG (1000 UNIT) tablet Take 1,000 Units by mouth daily.   Yes [provider]  Ginkgo Biloba 120 MG CAPS Take 120 mg by mouth daily.   Yes [provider]  ibuprofen (ADVIL,MOTRIN) 200 MG tablet Take 400 mg by mouth every 8 (eight) hours as needed for moderate pain (knee pain.).    Yes [provider]  Javier Docker Oil 350 MG CAPS Take 350 mg by mouth daily.   Yes [provider]  Turmeric 500 MG CAPS Take 5,000 mg by mouth daily.   Yes [provider]  vitamin B-12 (CYANOCOBALAMIN) 1000 MCG tablet Take 1,000 mcg by mouth daily.   Yes  [provider]    Allergies as of 07/21/2020 - Review Complete 07/21/2020  Allergen Reaction Noted  . Codeine    . Other Nausea And Vomiting 11/24/2015    Family History  Problem Relation Age of Onset  . Cancer Mother        colon  . Other Mother        aneursym  . Cancer Brother        colon  . Cancer Sister        bone,breast    Social History   Socioeconomic History  . Marital status: Divorced    Spouse name: Not on file  . Number of children: Not on file  . Years of education: Not on file  . Highest education level: Not on file  Occupational History  . Not on file  Tobacco Use  . Smoking status: Light Tobacco Smoker    Packs/day: 0.10    Years: 12.00    Pack years: 1.20    Types: Cigarettes  . Smokeless tobacco: Never Used  . Tobacco comment: smokes 1 pack per week  Vaping Use  . Vaping Use: Never used  Substance and Sexual Activity  . Alcohol use: No  . Drug use: No  . Sexual activity: Not Currently    Birth control/protection: Post-menopausal  Other Topics Concern  . Not on file  Social History Narrative  . Not  on file   Social Determinants of Health   Financial Resource Strain: Low Risk   . Difficulty of Paying Living Expenses: Not hard at all  Food Insecurity: No Food Insecurity  . Worried About Charity fundraiser in the Last Year: Never true  . Ran Out of Food in the Last Year: Never true  Transportation Needs: No Transportation Needs  . Lack of Transportation (Medical): No  . Lack of Transportation (Non-Medical): No  Physical Activity: Insufficiently Active  . Days of Exercise per Week: 4 days  . Minutes of Exercise per Session: 20 min  Stress: No Stress Concern Present  . Feeling of Stress : Not at all  Social Connections: Moderately Isolated  . Frequency of Communication with Friends and Family: More than three times a week  . Frequency of Social Gatherings with Friends and Family: Twice a week  . Attends Religious Services:  1 to 4 times per year  . Active Member of Clubs or Organizations: No  . Attends Archivist Meetings: Never  . Marital Status: Divorced  Human resources officer Violence: Not At Risk  . Fear of Current or Ex-Partner: No  . Emotionally Abused: No  . Physically Abused: No  . Sexually Abused: No    Review of Systems: See HPI, otherwise negative ROS  Physical Exam: BP (!) 153/72   Pulse 64   Temp 98.2 F (36.8 C) (Oral)   Resp 18   Ht 5\' 5"  (1.651 m)   Wt 69.9 kg   SpO2 99%   BMI 25.63 kg/m  General:   Alert,  Well-developed, well-nourished, pleasant and cooperative in NAD Neck:  Supple; no masses or thyromegaly. No significant cervical adenopathy. Lungs:  Clear throughout to auscultation.   No wheezes, crackles, or rhonchi. No acute distress. Heart:  Regular rate and rhythm; no murmurs, clicks, rubs,  or gallops. Abdomen: Non-distended, normal bowel sounds.  Soft and nontender without appreciable mass or hepatosplenomegaly.  Pulses:  Normal pulses noted. Extremities:  Without clubbing or edema.  Impression/Plan: 79 year old lady here for surveillance colonoscopy.  History of colonic adenomas. The risks, benefits, limitations, alternatives and imponderables have been reviewed with the patient. Questions have been answered. All parties are agreeable.      Notice: This dictation was prepared with Dragon dictation along with smaller phrase technology. Any transcriptional errors that result from this process are unintentional and may not be corrected upon review.

## 2020-09-02 NOTE — Discharge Instructions (Signed)
Colonoscopy Discharge Instructions  Read the instructions outlined below and refer to this sheet in the next few weeks. These discharge instructions provide you with general information on caring for yourself after you leave the hospital. Your doctor may also give you specific instructions. While your treatment has been planned according to the most current medical practices available, unavoidable complications occasionally occur. If you have any problems or questions after discharge, call Dr. Gala Romney at [phone]. ACTIVITY  You may resume your regular activity, but move at a slower pace for the next 24 hours.   Take frequent rest periods for the next 24 hours.   Walking will help get rid of the air and reduce the bloated feeling in your belly (abdomen).   No driving for 24 hours (because of the medicine (anesthesia) used during the test).    Do not sign any important legal documents or operate any machinery for 24 hours (because of the anesthesia used during the test).  NUTRITION  Drink plenty of fluids.   You may resume your normal diet as instructed by your doctor.   Begin with a light meal and progress to your normal diet. Heavy or fried foods are harder to digest and may make you feel sick to your stomach (nauseated).   Avoid alcoholic beverages for 24 hours or as instructed.  MEDICATIONS  You may resume your normal medications unless your doctor tells you otherwise.  WHAT YOU CAN EXPECT TODAY  Some feelings of bloating in the abdomen.   Passage of more gas than usual.   Spotting of blood in your stool or on the toilet paper.  IF YOU HAD POLYPS REMOVED DURING THE COLONOSCOPY:  No aspirin products for 7 days or as instructed.   No alcohol for 7 days or as instructed.   Eat a soft diet for the next 24 hours.  FINDING OUT THE RESULTS OF YOUR TEST Not all test results are available during your visit. If your test results are not back during the visit, make an appointment  with your caregiver to find out the results. Do not assume everything is normal if you have not heard from your caregiver or the medical facility. It is important for you to follow up on all of your test results.  SEEK IMMEDIATE MEDICAL ATTENTION IF:  You have more than a spotting of blood in your stool.   Your belly is swollen (abdominal distention).   You are nauseated or vomiting.   You have a temperature over 101.   You have abdominal pain or discomfort that is severe or gets worse throughout the day.    Colon Polyps  Polyps are tissue growths inside the body. Polyps can grow in many places, including the large intestine (colon). A polyp may be a round bump or a mushroom-shaped growth. You could have one polyp or several. Most colon polyps are noncancerous (benign). However, some colon polyps can become cancerous over time. Finding and removing the polyps early can help prevent this. What are the causes? The exact cause of colon polyps is not known. What increases the risk? You are more likely to develop this condition if you:  Have a family history of colon cancer or colon polyps.  Are older than 67 or older than 45 if you are African American.  Have inflammatory bowel disease, such as ulcerative colitis or Crohn's disease.  Have certain hereditary conditions, such as: ? Familial adenomatous polyposis. ? Lynch syndrome. ? Turcot syndrome. ? Peutz-Jeghers syndrome.  Are  overweight.  Smoke cigarettes.  Do not get enough exercise.  Drink too much alcohol.  Eat a diet that is high in fat and red meat and low in fiber.  Had childhood cancer that was treated with abdominal radiation. What are the signs or symptoms? Most polyps do not cause symptoms. If you have symptoms, they may include:  Blood coming from your rectum when having a bowel movement.  Blood in your stool. The stool may look dark red or black.  Abdominal pain.  A change in bowel habits, such as  constipation or diarrhea. How is this diagnosed? This condition is diagnosed with a colonoscopy. This is a procedure in which a lighted, flexible scope is inserted into the anus and then passed into the colon to examine the area. Polyps are sometimes found when a colonoscopy is done as part of routine cancer screening tests. How is this treated? Treatment for this condition involves removing any polyps that are found. Most polyps can be removed during a colonoscopy. Those polyps will then be tested for cancer. Additional treatment may be needed depending on the results of testing. Follow these instructions at home: Lifestyle  Maintain a healthy weight, or lose weight if recommended by your health care provider.  Exercise every day or as told by your health care provider.  Do not use any products that contain nicotine or tobacco, such as cigarettes and e-cigarettes. If you need help quitting, ask your health care provider.  If you drink alcohol, limit how much you have: ? 0-1 drink a day for women. ? 0-2 drinks a day for men.  Be aware of how much alcohol is in your drink. In the [place] one drink equals one 12 oz bottle of beer (355 mL), one 5 oz glass of wine (148 mL), or one 1 oz shot of hard liquor (44 mL). Eating and drinking   Eat foods that are high in fiber, such as fruits, vegetables, and whole grains.  Eat foods that are high in calcium and vitamin D, such as milk, cheese, yogurt, eggs, liver, fish, and broccoli.  Limit foods that are high in fat, such as fried foods and desserts.  Limit the amount of red meat and processed meat you eat, such as hot dogs, sausage, bacon, and lunch meats. General instructions  Keep all follow-up visits as told by your health care provider. This is important. ? This includes having regularly scheduled colonoscopies. ? Talk to your health care provider about when you need a colonoscopy. Contact a health care provider if:  You have new or  worsening bleeding during a bowel movement.  You have new or increased blood in your stool.  You have a change in bowel habits.  You lose weight for no known reason. Summary  Polyps are tissue growths inside the body. Polyps can grow in many places, including the colon.  Most colon polyps are noncancerous (benign), but some can become cancerous over time.  This condition is diagnosed with a colonoscopy.  Treatment for this condition involves removing any polyps that are found. Most polyps can be removed during a colonoscopy. This information is not intended to replace advice given to you by your health care provider. Make sure you discuss any questions you have with your health care provider. Document Revised: 02/01/2018 Document Reviewed: 02/01/2018 Elsevier Patient Education  [place].  Diverticulosis  Diverticulosis is a condition that develops when small pouches (diverticula) form in the wall of the large intestine (colon). The  colon is where water is absorbed and stool (feces) is formed. The pouches form when the inside layer of the colon pushes through weak spots in the outer layers of the colon. You may have a few pouches or many of them. The pouches usually do not cause problems unless they become inflamed or infected. When this happens, the condition is called diverticulitis. What are the causes? The cause of this condition is not known. What increases the risk? The following factors may make you more likely to develop this condition:  Being older than age 75. Your risk for this condition increases with age. Diverticulosis is rare among people younger than age 19. By age 59, many people have it.  Eating a low-fiber diet.  Having frequent constipation.  Being overweight.  Not getting enough exercise.  Smoking.  Taking over-the-counter pain medicines, like aspirin and ibuprofen.  Having a family history of diverticulosis. What are the signs or  symptoms? In most people, there are no symptoms of this condition. If you do have symptoms, they may include:  Bloating.  Cramps in the abdomen.  Constipation or diarrhea.  Pain in the lower left side of the abdomen. How is this diagnosed? Because diverticulosis usually has no symptoms, it is most often diagnosed during an exam for other colon problems. The condition may be diagnosed by:  Using a flexible scope to examine the colon (colonoscopy).  Taking an X-ray of the colon after dye has been put into the colon (barium enema).  Having a CT scan. How is this treated? You may not need treatment for this condition. Your health care provider may recommend treatment to prevent problems. You may need treatment if you have symptoms or if you previously had diverticulitis. Treatment may include:  Eating a high-fiber diet.  Taking a fiber supplement.  Taking a live bacteria supplement (probiotic).  Taking medicine to relax your colon. Follow these instructions at home: Medicines  Take over-the-counter and prescription medicines only as told by your health care provider.  If told by your health care provider, take a fiber supplement or probiotic. Constipation prevention Your condition may cause constipation. To prevent or treat constipation, you may need to:  Drink enough fluid to keep your urine pale yellow.  Take over-the-counter or prescription medicines.  Eat foods that are high in fiber, such as beans, whole grains, and fresh fruits and vegetables.  Limit foods that are high in fat and processed sugars, such as fried or sweet foods.  General instructions  Try not to strain when you have a bowel movement.  Keep all follow-up visits as told by your health care provider. This is important. Contact a health care provider if you:  Have pain in your abdomen.  Have bloating.  Have cramps.  Have not had a bowel movement in 3 days. Get help right away if:  Your pain  gets worse.  Your bloating becomes very bad.  You have a fever or chills, and your symptoms suddenly get worse.  You vomit.  You have bowel movements that are bloody or black.  You have bleeding from your rectum. Summary  Diverticulosis is a condition that develops when small pouches (diverticula) form in the wall of the large intestine (colon).  You may have a few pouches or many of them.  This condition is most often diagnosed during an exam for other colon problems.  Treatment may include increasing the fiber in your diet, taking supplements, or taking medicines. This information is not intended  to replace advice given to you by your health care provider. Make sure you discuss any questions you have with your health care provider. Document Revised: 05/16/2019 Document Reviewed: 05/16/2019 Elsevier Patient Education  [employer].   1 polyp removed from your colon today  Diverticulosis information provided  Further recommendations to follow pending review of pathology report  At patient request, I called Alroy Dust at [phone] -reviewed findings and recommendations

## 2020-09-03 ENCOUNTER — Encounter: Payer: Self-pay | Admitting: Internal Medicine

## 2020-09-03 LAB — SURGICAL PATHOLOGY

## 2020-09-08 ENCOUNTER — Encounter (HOSPITAL_COMMUNITY): Payer: Self-pay | Admitting: Internal Medicine

## 2020-10-26 DIAGNOSIS — H25813 Combined forms of age-related cataract, bilateral: Secondary | ICD-10-CM | POA: Diagnosis not present

## 2020-10-26 DIAGNOSIS — H01001 Unspecified blepharitis right upper eyelid: Secondary | ICD-10-CM | POA: Diagnosis not present

## 2020-10-26 DIAGNOSIS — H02834 Dermatochalasis of left upper eyelid: Secondary | ICD-10-CM | POA: Diagnosis not present

## 2020-10-26 DIAGNOSIS — H01004 Unspecified blepharitis left upper eyelid: Secondary | ICD-10-CM | POA: Diagnosis not present

## 2020-10-26 DIAGNOSIS — H02831 Dermatochalasis of right upper eyelid: Secondary | ICD-10-CM | POA: Diagnosis not present

## 2020-10-26 DIAGNOSIS — H01005 Unspecified blepharitis left lower eyelid: Secondary | ICD-10-CM | POA: Diagnosis not present

## 2020-10-26 DIAGNOSIS — H11153 Pinguecula, bilateral: Secondary | ICD-10-CM | POA: Diagnosis not present

## 2020-10-26 DIAGNOSIS — H01002 Unspecified blepharitis right lower eyelid: Secondary | ICD-10-CM | POA: Diagnosis not present

## 2020-11-05 NOTE — H&P (Signed)
Surgical History & Physical  Patient Name: Misty Wilson DOB: 01/07/1941  Surgery: Cataract extraction with intraocular lens implant phacoemulsification; Right Eye  Surgeon: Fabio Pierce MD Surgery Date:  11/23/2020 Pre-Op Date:  10/26/2020  HPI: A 61 Yr. old female patient was here last year for cataract eval. 1. The patient complains of difficulty when viewing TV, reading closed caption, news scrolls on TV, which began 1 year ago. Both eyes are affected. The episode is gradual. The condition's severity increased since last visit. Symptoms occur when the patient is driving and outside. Pt c/o diff driving at night due to glare. This is negatively affecting her quality of life. Pt is using ATs occasionally. HPI Completed by Dr. Fabio Pierce  Medical History: Cataracts  Review of Systems Negative Allergic/Immunologic Negative Cardiovascular Negative Constitutional Negative Ear, Nose, Mouth & Throat Negative Endocrine Negative Eyes Negative Gastrointestinal Negative Genitourinary Negative Hemotologic/Lymphatic Negative Integumentary Negative Musculoskeletal Negative Neurological Negative Psychiatry Negative Respiratory  Social   Current some day smoker   Medication  None  Sx/Procedures Gallbladder, Tubal Ligation,   Drug Allergies  Erythomycin, Prednisone,   History & Physical: Heent:  Cataract, Right eye NECK: supple without bruits LUNGS: lungs clear to auscultation CV: regular rate and rhythm Abdomen: soft and non-tender  Impression & Plan: Assessment: 1.  COMBINED FORMS AGE RELATED CATARACT; Both Eyes (H25.813) 2.  BLEPHARITIS; Right Upper Lid, Right Lower Lid, Left Upper Lid, Left Lower Lid (H01.001, H01.002,H01.004,H01.005) 3.  Pinguecula; Both Eyes (H11.153) 4.  DERMATOCHALASIS; Right Upper Lid, Left Upper Lid (H02.831, W25.852)  Plan: 1.  Cataract accounts for the patient's decreased vision. This visual impairment is not correctable with a tolerable  change in glasses or contact lenses. Cataract surgery with an implantation of a new lens should significantly improve the visual and functional status of the patient. Discussed all risks, benefits, alternatives, and potential complications. Discussed the procedures and recovery. Patient desires to have surgery. A-scan ordered and performed today for intra-ocular lens calculations. The surgery will be performed in order to improve vision for driving, reading, and for eye examinations. Recommend phacoemulsification with intra-ocular lens. Right Eye worse - first. Dilates well - shugarcaine by protocol. Discussed multifocal - declined. 2.  Recommend regular lid cleaning. 3.  Observe; Artificial tears as needed for irritation. 4.  Asymptomatic, recommend observation for now. Findings, prognosis and treatment options reviewed.

## 2020-11-17 ENCOUNTER — Other Ambulatory Visit: Payer: Self-pay

## 2020-11-17 ENCOUNTER — Encounter (HOSPITAL_COMMUNITY)
Admission: RE | Admit: 2020-11-17 | Discharge: 2020-11-17 | Disposition: A | Discharge status: Home / Self Care | Payer: PPO | Source: Ambulatory Visit | Attending: Ophthalmology | Admitting: Ophthalmology

## 2020-11-20 ENCOUNTER — Other Ambulatory Visit: Payer: Self-pay

## 2020-11-20 ENCOUNTER — Other Ambulatory Visit (HOSPITAL_COMMUNITY)
Admission: RE | Admit: 2020-11-20 | Discharge: 2020-11-20 | Disposition: A | Discharge status: Home / Self Care | Payer: PPO | Source: Ambulatory Visit | Attending: Ophthalmology | Admitting: Ophthalmology

## 2020-11-20 DIAGNOSIS — Z01812 Encounter for preprocedural laboratory examination: Secondary | ICD-10-CM | POA: Diagnosis not present

## 2020-11-20 DIAGNOSIS — Z20822 Contact with and (suspected) exposure to covid-19: Secondary | ICD-10-CM | POA: Insufficient documentation

## 2020-11-20 DIAGNOSIS — H25811 Combined forms of age-related cataract, right eye: Secondary | ICD-10-CM | POA: Diagnosis not present

## 2020-11-20 LAB — SARS CORONAVIRUS 2 (TAT 6-24 HRS): SARS Coronavirus 2: NEGATIVE

## 2020-11-23 ENCOUNTER — Ambulatory Visit (HOSPITAL_COMMUNITY)
Admission: RE | Admit: 2020-11-23 | Discharge: 2020-11-23 | Disposition: A | Discharge status: Home / Self Care | Payer: PPO | Attending: Ophthalmology | Admitting: Ophthalmology

## 2020-11-23 ENCOUNTER — Ambulatory Visit (HOSPITAL_COMMUNITY): Payer: PPO | Admitting: Certified Registered"

## 2020-11-23 ENCOUNTER — Other Ambulatory Visit: Payer: Self-pay

## 2020-11-23 ENCOUNTER — Encounter (HOSPITAL_COMMUNITY)
Admission: RE | Disposition: A | Discharge status: Home / Self Care | Payer: Self-pay | Source: Home / Self Care | Attending: Ophthalmology

## 2020-11-23 ENCOUNTER — Encounter (HOSPITAL_COMMUNITY): Payer: Self-pay | Admitting: Ophthalmology

## 2020-11-23 DIAGNOSIS — H25811 Combined forms of age-related cataract, right eye: Secondary | ICD-10-CM | POA: Diagnosis not present

## 2020-11-23 HISTORY — PX: CATARACT EXTRACTION W/PHACO: SHX586

## 2020-11-23 SURGERY — PHACOEMULSIFICATION, CATARACT, WITH IOL INSERTION
Anesthesia: Monitor Anesthesia Care | Site: Eye | Laterality: Right

## 2020-11-23 MED ORDER — TROPICAMIDE 1 % OP SOLN
1.0000 [drp] | OPHTHALMIC | Status: AC
  Administered 2020-11-23 (×3): 1 [drp] via OPHTHALMIC

## 2020-11-23 MED ORDER — EPINEPHRINE PF 1 MG/ML IJ SOLN
INTRAMUSCULAR | Status: AC
  Filled 2020-11-23: qty 2

## 2020-11-23 MED ORDER — LIDOCAINE HCL (PF) 1 % IJ SOLN
INTRAOCULAR | Status: DC | PRN
  Administered 2020-11-23: 1 mL via OPHTHALMIC

## 2020-11-23 MED ORDER — STERILE WATER FOR IRRIGATION IR SOLN
Status: DC | PRN
  Administered 2020-11-23: 250 mL

## 2020-11-23 MED ORDER — LIDOCAINE HCL 3.5 % OP GEL
1.0000 "application " | Freq: Once | OPHTHALMIC | Status: AC
  Administered 2020-11-23: 1 via OPHTHALMIC

## 2020-11-23 MED ORDER — BSS IO SOLN
INTRAOCULAR | Status: DC | PRN
  Administered 2020-11-23: 15 mL

## 2020-11-23 MED ORDER — EPINEPHRINE PF 1 MG/ML IJ SOLN
INTRAOCULAR | Status: DC | PRN
  Administered 2020-11-23: 500 mL

## 2020-11-23 MED ORDER — POVIDONE-IODINE 5 % OP SOLN
OPHTHALMIC | Status: DC | PRN
  Administered 2020-11-23: 1 via OPHTHALMIC

## 2020-11-23 MED ORDER — NEOMYCIN-POLYMYXIN-DEXAMETH 3.5-10000-0.1 OP SUSP
OPHTHALMIC | Status: DC | PRN
  Administered 2020-11-23: 1 [drp] via OPHTHALMIC

## 2020-11-23 MED ORDER — PHENYLEPHRINE HCL 2.5 % OP SOLN
1.0000 [drp] | OPHTHALMIC | Status: AC | PRN
  Administered 2020-11-23 (×3): 1 [drp] via OPHTHALMIC

## 2020-11-23 MED ORDER — SODIUM HYALURONATE 23 MG/ML IO SOLN
INTRAOCULAR | Status: DC | PRN
  Administered 2020-11-23: 0.6 mL via INTRAOCULAR

## 2020-11-23 MED ORDER — TETRACAINE HCL 0.5 % OP SOLN
1.0000 [drp] | OPHTHALMIC | Status: AC | PRN
  Administered 2020-11-23 (×3): 1 [drp] via OPHTHALMIC

## 2020-11-23 MED ORDER — PROVISC 10 MG/ML IO SOLN
INTRAOCULAR | Status: DC | PRN
  Administered 2020-11-23: 0.85 mL via INTRAOCULAR

## 2020-11-23 SURGICAL SUPPLY — 12 items
CLOTH BEACON ORANGE TIMEOUT ST (SAFETY) ×2 IMPLANT
EYE SHIELD UNIVERSAL CLEAR (GAUZE/BANDAGES/DRESSINGS) ×2 IMPLANT
GLOVE BIOGEL PI IND STRL 7.0 (GLOVE) ×2 IMPLANT
GLOVE BIOGEL PI INDICATOR 7.0 (GLOVE) ×2
NEEDLE HYPO 18GX1.5 BLUNT FILL (NEEDLE) ×2 IMPLANT
PAD ARMBOARD 7.5X6 YLW CONV (MISCELLANEOUS) ×2 IMPLANT
SYR TB 1ML LL NO SAFETY (SYRINGE) ×2 IMPLANT
TAPE SURG TRANSPORE 1 IN (GAUZE/BANDAGES/DRESSINGS) ×1 IMPLANT
TAPE SURGICAL TRANSPORE 1 IN (GAUZE/BANDAGES/DRESSINGS) ×2
Tecnis 1 piece IOL (Intraocular Lens) ×2 IMPLANT
VISCOELASTIC ADDITIONAL (OPHTHALMIC RELATED) IMPLANT
WATER STERILE IRR 250ML POUR (IV SOLUTION) ×2 IMPLANT

## 2020-11-23 NOTE — Transfer of Care (Signed)
Immediate Anesthesia Transfer of Care Note  Patient: [place]  Procedure(s) Performed: CATARACT EXTRACTION PHACO AND INTRAOCULAR LENS PLACEMENT RIGHT EYE (Right Eye)  Patient Location: Short Stay  Anesthesia Type:MAC  Level of Consciousness: awake, alert  and oriented  Airway & Oxygen Therapy: Patient Spontanous Breathing  Post-op Assessment: Report given to RN and Post -op Vital signs reviewed and stable  Post vital signs: Reviewed and stable  Last Vitals:  Vitals Value Taken Time  BP    Temp    Pulse    Resp    SpO2      Last Pain:  Vitals:   11/23/20 1053  TempSrc: Oral  PainSc: 0-No pain      Patients Stated Pain Goal: 5 (18/34/37 3578)  Complications: No complications documented.

## 2020-11-23 NOTE — Interval H&P Note (Signed)
History and Physical Interval Note:  11/23/2020 11:14 AM  Misty Wilson  has presented today for surgery, with the diagnosis of Nuclear sclerotic cataract - Right eye.  The various methods of treatment have been discussed with the patient and family. After consideration of risks, benefits and other options for treatment, the patient has consented to  Procedure(s) with comments: CATARACT EXTRACTION PHACO AND INTRAOCULAR LENS PLACEMENT RIGHT EYE (Right) - right as a surgical intervention.  The patient's history has been reviewed, patient examined, no change in status, stable for surgery.  I have reviewed the patient's chart and labs.  Questions were answered to the patient's satisfaction.     [name]

## 2020-11-23 NOTE — Anesthesia Procedure Notes (Signed)
Procedure Name: MAC Date/Time: 11/23/2020 11:59 AM Performed by: Orlie Dakin, CRNA Pre-anesthesia Checklist: Patient identified, Emergency Drugs available, Suction available and Patient being monitored Patient Re-evaluated:Patient Re-evaluated prior to induction Oxygen Delivery Method: Nasal cannula Placement Confirmation: positive ETCO2

## 2020-11-23 NOTE — Anesthesia Postprocedure Evaluation (Signed)
Anesthesia Post Note  Patient: Misty Wilson  Procedure(s) Performed: CATARACT EXTRACTION PHACO AND INTRAOCULAR LENS PLACEMENT RIGHT EYE (Right Eye)  Patient location during evaluation: Phase II Anesthesia Type: MAC Level of consciousness: awake and alert and oriented Pain management: pain level controlled Vital Signs Assessment: post-procedure vital signs reviewed and stable Respiratory status: spontaneous breathing, nonlabored ventilation and respiratory function stable Cardiovascular status: blood pressure returned to baseline and stable Postop Assessment: no apparent nausea or vomiting Anesthetic complications: no   No complications documented.   Last Vitals:  Vitals:   11/23/20 1053  BP: (!) 148/67  Pulse: (!) 55  Resp: 20  Temp: 37.5 C  SpO2: 90%    Last Pain:  Vitals:   11/23/20 1053  TempSrc: Oral  PainSc: 0-No pain                 Orlie Dakin

## 2020-11-23 NOTE — Anesthesia Preprocedure Evaluation (Addendum)
Anesthesia Evaluation  Patient identified by MRN, date of birth, ID band Patient awake    Reviewed: Allergy & Precautions, H&P , NPO status , Patient's Chart, lab work & pertinent test results, reviewed documented beta blocker date and time   Airway Mallampati: II  TM Distance: >3 FB Neck ROM: full    Dental no notable dental hx.    Pulmonary neg pulmonary ROS, Current Smoker,    Pulmonary exam normal breath sounds clear to auscultation       Cardiovascular Exercise Tolerance: Good negative cardio ROS   Rhythm:regular Rate:Normal     Neuro/Psych negative neurological ROS  negative psych ROS   GI/Hepatic Neg liver ROS, PUD,   Endo/Other  negative endocrine ROS  Renal/GU negative Renal ROS  negative genitourinary   Musculoskeletal   Abdominal   Peds  Hematology negative hematology ROS (+)   Anesthesia Other Findings   Reproductive/Obstetrics negative OB ROS                             Anesthesia Physical Anesthesia Plan  ASA: II  Anesthesia Plan: MAC   Post-op Pain Management:    Induction:   PONV Risk Score and Plan:   Airway Management Planned:   Additional Equipment:   Intra-op Plan:   Post-operative Plan:   Informed Consent: I have reviewed the patients History and Physical, chart, labs and discussed the procedure including the risks, benefits and alternatives for the proposed anesthesia with the patient or authorized representative who has indicated his/her understanding and acceptance.     Dental Advisory Given  Plan Discussed with: CRNA  Anesthesia Plan Comments:         Anesthesia Quick Evaluation

## 2020-11-23 NOTE — Op Note (Signed)
Date of procedure: 11/23/20  Pre-operative diagnosis:  Visually significant combined form age-related cataract, Right Eye (H25.811)  Post-operative diagnosis:  Visually significant combined form age-related cataract, Right Eye (H25.811)  Procedure: Removal of cataract via phacoemulsification and insertion of intra-ocular lens Wynetta Emery and [employer] [id number]  +23.5D into the capsular bag of the Right Eye  Attending surgeon: Gerda Diss. Calyx Hawker, MD, MA  Anesthesia: MAC, Topical Akten  Complications: None  Estimated Blood Loss: <52m (minimal)  Specimens: None  Implants: As above  Indications:  Visually significant age-related cataract, Right Eye  Procedure:  The patient was seen and identified in the pre-operative area. The operative eye was identified and dilated.  The operative eye was marked.  Topical anesthesia was administered to the operative eye.     The patient was then to the operative suite and placed in the supine position.  A timeout was performed confirming the patient, procedure to be performed, and all other relevant information.   The patient's face was prepped and draped in the usual fashion for intra-ocular surgery.  A lid speculum was placed into the operative eye and the surgical microscope moved into place and focused.  A superotemporal paracentesis was created using a 20 gauge paracentesis blade.  Shugarcaine was injected into the anterior chamber.  Viscoelastic was injected into the anterior chamber.  A temporal clear-corneal main wound incision was created using a 2.466mmicrokeratome.  A continuous curvilinear capsulorrhexis was initiated using an irrigating cystitome and completed using capsulorrhexis forceps.  Hydrodissection and hydrodeliniation were performed.  Viscoelastic was injected into the anterior chamber.  A phacoemulsification handpiece and a chopper as a second instrument were used to remove the nucleus and epinucleus. The irrigation/aspiration handpiece was  used to remove any remaining cortical material.   The capsular bag was reinflated with viscoelastic, checked, and found to be intact.  The intraocular lens was inserted into the capsular bag.  The irrigation/aspiration handpiece was used to remove any remaining viscoelastic.  The clear corneal wound and paracentesis wounds were then hydrated and checked with Weck-Cels to be watertight.  The lid-speculum was removed.  The drape was removed.  The patient's face was cleaned with a wet and dry 4x4.   Maxitrol was instilled in the eye. A clear shield was taped over the eye. The patient was taken to the post-operative care unit in good condition, having tolerated the procedure well.  Post-Op Instructions: [facility] and will receive all other orders and instructions.

## 2020-11-24 ENCOUNTER — Encounter (HOSPITAL_COMMUNITY): Payer: Self-pay | Admitting: Ophthalmology

## 2020-12-07 DIAGNOSIS — H25812 Combined forms of age-related cataract, left eye: Secondary | ICD-10-CM | POA: Diagnosis not present

## 2020-12-08 NOTE — H&P (Signed)
Surgical History & Physical  Patient Name: Misty Wilson DOB: 04-21-1941  Surgery: Cataract extraction with intraocular lens implant phacoemulsification; Left Eye  Surgeon: [name] MD Surgery Date:  12/14/2020 Pre-Op Date:  12/07/2020  HPI: A 70 Yr. old female patient The patient is returning after cataract surgery. The right eye is affected. Status post cataract surgery, which began 2 week ago: Since the last visit, the affected area feels improvement and is doing well. The patient's vision is improved and stable. Pt is thrilled with outcome of OD! Patient is following medication instructions. Omni BID OD. The patient experiences no pain or tearing. The patient complains of difficulty when viewing TV, reading closed caption, news scrolls on TV, which began 1 year ago. The left eye is affected. The episode is gradual. The condition's severity increased since last visit. Symptoms occur when the patient is driving and outside. Pt c/o diff driving at night due to glare. This is negatively affecting her quality of life. HPI was performed by [name] .  Medical History: Cataracts  Review of Systems Negative Allergic/Immunologic Negative Cardiovascular Negative Constitutional Negative Ear, Nose, Mouth & Throat Negative Endocrine Negative Eyes Negative Gastrointestinal Negative Genitourinary Negative Hemotologic/Lymphatic Negative Integumentary Negative Musculoskeletal Negative Neurological Negative Psychiatry Negative Respiratory  Social   Current some day smoker  Medication Prednisolone-gatiflox-bromfenac,   Sx/Procedures Phaco c IOL OD,  Gallbladder, Tubal Ligation,   Drug Allergies  Erythomycin, Prednisone,   History & Physical: Heent:  Cataract, Left eye NECK: supple without bruits LUNGS: lungs clear to auscultation CV: regular rate and rhythm Abdomen: soft and non-tender  Impression & Plan: Assessment: 1.  CATARACT EXTRACTION STATUS; Right Eye  (Z98.41) 2.  INTRAOCULAR LENS IOL (Z96.1) 3.  COMBINED FORMS AGE RELATED CATARACT; Left Eye (H25.812) 4.  Presbyopia ; Both Eyes (H52.4)  Plan: 1.  1 week after cataract surgery. Doing well with improved vision and normal eye pressure. Call with any problems or concerns. Continue Pred-Moxi-Brom 2x/day for 3 more weeks. 2.  Doing well since surgery Continue Post-op medications 3.  Cataract accounts for the patient's decreased vision. This visual impairment is not correctable with a tolerable change in glasses or contact lenses. Cataract surgery with an implantation of a new lens should significantly improve the visual and functional status of the patient. Discussed all risks, benefits, alternatives, and potential complications. Discussed the procedures and recovery. Patient desires to have surgery. A-scan ordered and performed today for intra-ocular lens calculations. The surgery will be performed in order to improve vision for driving, reading, and for eye examinations. Recommend phacoemulsification with intra-ocular lens. Recommend Dextenza for post-operative pain and inflammation. Left Eye. Surgery required to correct imbalance of vision. Dilates well - shugarcaine by protocol.

## 2020-12-09 ENCOUNTER — Other Ambulatory Visit: Payer: Self-pay

## 2020-12-09 ENCOUNTER — Encounter (HOSPITAL_COMMUNITY)
Admission: RE | Admit: 2020-12-09 | Discharge: 2020-12-09 | Disposition: A | Discharge status: Home / Self Care | Payer: PPO | Source: Ambulatory Visit | Attending: Ophthalmology | Admitting: Ophthalmology

## 2020-12-11 ENCOUNTER — Encounter (HOSPITAL_COMMUNITY): Payer: Self-pay

## 2020-12-11 ENCOUNTER — Other Ambulatory Visit: Payer: Self-pay

## 2020-12-11 ENCOUNTER — Other Ambulatory Visit (HOSPITAL_COMMUNITY)
Admission: RE | Admit: 2020-12-11 | Discharge: 2020-12-11 | Disposition: A | Discharge status: Home / Self Care | Payer: PPO | Source: Ambulatory Visit | Attending: Ophthalmology | Admitting: Ophthalmology

## 2020-12-11 DIAGNOSIS — Z20822 Contact with and (suspected) exposure to covid-19: Secondary | ICD-10-CM | POA: Diagnosis not present

## 2020-12-11 DIAGNOSIS — Z01812 Encounter for preprocedural laboratory examination: Secondary | ICD-10-CM | POA: Diagnosis not present

## 2020-12-11 LAB — SARS CORONAVIRUS 2 (TAT 6-24 HRS): SARS Coronavirus 2: NEGATIVE

## 2020-12-14 ENCOUNTER — Encounter (HOSPITAL_COMMUNITY)
Admission: RE | Disposition: A | Discharge status: Home / Self Care | Payer: Self-pay | Source: Home / Self Care | Attending: Ophthalmology

## 2020-12-14 ENCOUNTER — Ambulatory Visit (HOSPITAL_COMMUNITY): Payer: PPO | Admitting: Anesthesiology

## 2020-12-14 ENCOUNTER — Encounter (HOSPITAL_COMMUNITY): Payer: Self-pay | Admitting: Ophthalmology

## 2020-12-14 ENCOUNTER — Ambulatory Visit (HOSPITAL_COMMUNITY)
Admission: RE | Admit: 2020-12-14 | Discharge: 2020-12-14 | Disposition: A | Discharge status: Home / Self Care | Payer: PPO | Attending: Ophthalmology | Admitting: Ophthalmology

## 2020-12-14 DIAGNOSIS — Z881 Allergy status to other antibiotic agents status: Secondary | ICD-10-CM | POA: Insufficient documentation

## 2020-12-14 DIAGNOSIS — H524 Presbyopia: Secondary | ICD-10-CM | POA: Diagnosis not present

## 2020-12-14 DIAGNOSIS — Z888 Allergy status to other drugs, medicaments and biological substances status: Secondary | ICD-10-CM | POA: Insufficient documentation

## 2020-12-14 DIAGNOSIS — H25812 Combined forms of age-related cataract, left eye: Secondary | ICD-10-CM | POA: Insufficient documentation

## 2020-12-14 DIAGNOSIS — F17200 Nicotine dependence, unspecified, uncomplicated: Secondary | ICD-10-CM | POA: Diagnosis not present

## 2020-12-14 HISTORY — PX: CATARACT EXTRACTION W/PHACO: SHX586

## 2020-12-14 SURGERY — PHACOEMULSIFICATION, CATARACT, WITH IOL INSERTION
Anesthesia: Monitor Anesthesia Care | Site: Eye | Laterality: Left

## 2020-12-14 MED ORDER — PROVISC 10 MG/ML IO SOLN
INTRAOCULAR | Status: DC | PRN
  Administered 2020-12-14: 0.85 mL via INTRAOCULAR

## 2020-12-14 MED ORDER — SODIUM HYALURONATE 23 MG/ML IO SOLN
INTRAOCULAR | Status: DC | PRN
  Administered 2020-12-14: 0.6 mL via INTRAOCULAR

## 2020-12-14 MED ORDER — EPINEPHRINE PF 1 MG/ML IJ SOLN
INTRAMUSCULAR | Status: AC
  Filled 2020-12-14: qty 2

## 2020-12-14 MED ORDER — LIDOCAINE HCL 3.5 % OP GEL
1.0000 "application " | Freq: Once | OPHTHALMIC | Status: AC
  Administered 2020-12-14: 1 via OPHTHALMIC

## 2020-12-14 MED ORDER — PHENYLEPHRINE HCL 2.5 % OP SOLN
1.0000 [drp] | OPHTHALMIC | Status: AC | PRN
  Administered 2020-12-14 (×3): 1 [drp] via OPHTHALMIC

## 2020-12-14 MED ORDER — TETRACAINE HCL 0.5 % OP SOLN
1.0000 [drp] | OPHTHALMIC | Status: AC | PRN
  Administered 2020-12-14 (×3): 1 [drp] via OPHTHALMIC

## 2020-12-14 MED ORDER — BSS IO SOLN
INTRAOCULAR | Status: DC | PRN
  Administered 2020-12-14: 15 mL via INTRAOCULAR

## 2020-12-14 MED ORDER — TROPICAMIDE 1 % OP SOLN
1.0000 [drp] | OPHTHALMIC | Status: AC
  Administered 2020-12-14 (×3): 1 [drp] via OPHTHALMIC

## 2020-12-14 MED ORDER — STERILE WATER FOR IRRIGATION IR SOLN
Status: DC | PRN
  Administered 2020-12-14: 250 mL

## 2020-12-14 MED ORDER — LIDOCAINE HCL (PF) 1 % IJ SOLN
INTRAOCULAR | Status: DC | PRN
  Administered 2020-12-14: 1 mL via OPHTHALMIC

## 2020-12-14 MED ORDER — POVIDONE-IODINE 5 % OP SOLN
OPHTHALMIC | Status: DC | PRN
  Administered 2020-12-14: 1 via OPHTHALMIC

## 2020-12-14 MED ORDER — EPINEPHRINE PF 1 MG/ML IJ SOLN
INTRAOCULAR | Status: DC | PRN
  Administered 2020-12-14: 500 mL

## 2020-12-14 SURGICAL SUPPLY — 13 items
CLOTH BEACON ORANGE TIMEOUT ST (SAFETY) ×2 IMPLANT
EYE SHIELD UNIVERSAL CLEAR (GAUZE/BANDAGES/DRESSINGS) ×2 IMPLANT
GLOVE SURG UNDER POLY LF SZ6.5 (GLOVE) ×2 IMPLANT
GLOVE SURG UNDER POLY LF SZ7 (GLOVE) ×4 IMPLANT
GOWN STRL REUS W/TWL LRG LVL3 (GOWN DISPOSABLE) ×2 IMPLANT
NEEDLE HYPO 18GX1.5 BLUNT FILL (NEEDLE) ×2 IMPLANT
PAD ARMBOARD 7.5X6 YLW CONV (MISCELLANEOUS) ×2 IMPLANT
SYR TB 1ML LL NO SAFETY (SYRINGE) ×2 IMPLANT
TAPE SURG TRANSPORE 1 IN (GAUZE/BANDAGES/DRESSINGS) ×1 IMPLANT
TAPE SURGICAL TRANSPORE 1 IN (GAUZE/BANDAGES/DRESSINGS) ×2
TECNIS IOL (Intraocular Lens) ×2 IMPLANT
VISCOELASTIC ADDITIONAL (OPHTHALMIC RELATED) IMPLANT
WATER STERILE IRR 250ML POUR (IV SOLUTION) ×2 IMPLANT

## 2020-12-14 NOTE — Op Note (Addendum)
Date of procedure: 12/14/20  Pre-operative diagnosis: Visually significant age-related combined cataract, Left Eye (H25.812)  Post-operative diagnosis: Visually significant age-related combined cataract, Left Eye (H25.812)  Procedure: Removal of cataract via phacoemulsification and insertion of intra-ocular lens [place]  +23.0D into the capsular bag of the Left Eye  Attending surgeon: Gerda Diss. Johnell Landowski, MD, MA  Anesthesia: MAC, Topical Akten  Complications: None  Estimated Blood Loss: <25m (minimal)  Specimens: None  Implants: As above  Indications:  Visually significant age-related cataract, Left Eye  Procedure:  The patient was seen and identified in the pre-operative area. The operative eye was identified and dilated.  The operative eye was marked.  Topical anesthesia was administered to the operative eye.     The patient was then to the operative suite and placed in the supine position.  A timeout was performed confirming the patient, procedure to be performed, and all other relevant information.   The patient's face was prepped and draped in the usual fashion for intra-ocular surgery.  A lid speculum was placed into the operative eye and the surgical microscope moved into place and focused.  An inferotemporal paracentesis was created using a 20 gauge paracentesis blade.  Shugarcaine was injected into the anterior chamber.  Viscoelastic was injected into the anterior chamber.  A temporal clear-corneal main wound incision was created using a 2.416mmicrokeratome.  A continuous curvilinear capsulorrhexis was initiated using an irrigating cystitome and completed using capsulorrhexis forceps.  Hydrodissection and hydrodeliniation were performed.  Viscoelastic was injected into the anterior chamber.  A phacoemulsification handpiece and a chopper as a second instrument were used to remove the nucleus and epinucleus. The irrigation/aspiration handpiece was used to remove  any remaining cortical material.   The capsular bag was reinflated with viscoelastic, checked, and found to be intact.  The intraocular lens was inserted into the capsular bag.  The irrigation/aspiration handpiece was used to remove any remaining viscoelastic.  The clear corneal wound and paracentesis wounds were then hydrated and checked with Weck-Cels to be watertight.  The lid-speculum was removed.  The drape was removed.  The patient's face was cleaned with a wet and dry 4x4.  A clear shield was taped over the eye. The patient was taken to the post-operative care unit in good condition, having tolerated the procedure well.  Post-Op Instructions: [facility] and will receive all other orders and instructions.

## 2020-12-14 NOTE — Interval H&P Note (Signed)
History and Physical Interval Note:  12/14/2020 12:25 PM  [place]  has presented today for surgery, with the diagnosis of Nuclear sclerotic cataract - Left eye.  The various methods of treatment have been discussed with the patient and family. After consideration of risks, benefits and other options for treatment, the patient has consented to  Procedure(s) with comments: CATARACT EXTRACTION PHACO AND INTRAOCULAR LENS PLACEMENT ([place]) (Left) - left as a surgical intervention.  The patient's history has been reviewed, patient examined, no change in status, stable for surgery.  I have reviewed the patient's chart and labs.  Questions were answered to the patient's satisfaction.     [name]

## 2020-12-14 NOTE — Anesthesia Postprocedure Evaluation (Signed)
Anesthesia Post Note  Patient: [place]  Procedure(s) Performed: CATARACT EXTRACTION PHACO AND INTRAOCULAR LENS PLACEMENT (I[facility]) (Left Eye)  Patient location during evaluation: Phase II Anesthesia Type: MAC Level of consciousness: awake and alert and oriented Pain management: pain level controlled Vital Signs Assessment: post-procedure vital signs reviewed and stable Respiratory status: spontaneous breathing, nonlabored ventilation and respiratory function stable Cardiovascular status: blood pressure returned to baseline and stable Postop Assessment: no apparent nausea or vomiting Anesthetic complications: no   No complications documented.   Last Vitals:  Vitals:   12/14/20 1129 12/14/20 1251  BP: (!) 142/74 (!) 149/80  Pulse: (!) 58 (!) 55  Resp: 19 17  Temp: 36.9 C   SpO2: 98% 99%    Last Pain:  Vitals:   12/14/20 1251  TempSrc: Oral  PainSc: 0-No pain                 Orlie Dakin

## 2020-12-14 NOTE — Discharge Instructions (Signed)
Please discharge patient when stable, will follow up today with Dr. Darden Flemister at the [facility] [place] office immediately following discharge.  Leave shield in place until visit.  All paperwork with discharge instructions will be given at the office.  [facility] [facility] Address:  [address]  [place], [place] [address]  

## 2020-12-14 NOTE — Transfer of Care (Signed)
Immediate Anesthesia Transfer of Care Note  Patient: Misty Wilson  Procedure(s) Performed: CATARACT EXTRACTION PHACO AND INTRAOCULAR LENS PLACEMENT (I[facility]) (Left Eye)  Patient Location: Short Stay  Anesthesia Type:MAC  Level of Consciousness: awake, alert  and oriented  Airway & Oxygen Therapy: Patient Spontanous Breathing  Post-op Assessment: Report given to RN and Post -op Vital signs reviewed and stable  Post vital signs: Reviewed and stable  Last Vitals:  Vitals Value Taken Time  BP    Temp    Pulse    Resp    SpO2      Last Pain:  Vitals:   12/14/20 1129  TempSrc: Oral  PainSc: 0-No pain      Patients Stated Pain Goal: 9 (72/18/28 8337)  Complications: No complications documented.

## 2020-12-14 NOTE — Anesthesia Preprocedure Evaluation (Signed)
Anesthesia Evaluation  Patient identified by MRN, date of birth, ID band Patient awake    Reviewed: Allergy & Precautions, NPO status , Patient's Chart, lab work & pertinent test results  History of Anesthesia Complications Negative for: history of anesthetic complications  Airway Mallampati: II  TM Distance: >3 FB Neck ROM: Full    Dental  (+) Dental Advisory Given No notable dental injury:   Pulmonary Current Smoker and Patient abstained from smoking.,    Pulmonary exam normal breath sounds clear to auscultation       Cardiovascular Exercise Tolerance: Good Normal cardiovascular exam Rhythm:Regular Rate:Normal     Neuro/Psych negative neurological ROS  negative psych ROS   GI/Hepatic Neg liver ROS, PUD,   Endo/Other  negative endocrine ROS  Renal/GU negative Renal ROS     Musculoskeletal negative musculoskeletal ROS (+)   Abdominal   Peds  Hematology negative hematology ROS (+)   Anesthesia Other Findings   Reproductive/Obstetrics negative OB ROS                             Anesthesia Physical Anesthesia Plan  ASA: II  Anesthesia Plan: MAC   Post-op Pain Management:    Induction:   PONV Risk Score and Plan:   Airway Management Planned: Nasal Cannula and Natural Airway  Additional Equipment:   Intra-op Plan:   Post-operative Plan:   Informed Consent: I have reviewed the patients History and Physical, chart, labs and discussed the procedure including the risks, benefits and alternatives for the proposed anesthesia with the patient or authorized representative who has indicated his/her understanding and acceptance.     Dental advisory given  Plan Discussed with: CRNA and Surgeon  Anesthesia Plan Comments:         Anesthesia Quick Evaluation

## 2020-12-14 NOTE — Anesthesia Procedure Notes (Signed)
Procedure Name: MAC Date/Time: 12/14/2020 12:31 PM Performed by: Orlie Dakin, CRNA Pre-anesthesia Checklist: Patient identified, Emergency Drugs available, Suction available and Patient being monitored Patient Re-evaluated:Patient Re-evaluated prior to induction Oxygen Delivery Method: Nasal cannula Placement Confirmation: positive ETCO2

## 2020-12-15 ENCOUNTER — Encounter (HOSPITAL_COMMUNITY): Payer: Self-pay | Admitting: Ophthalmology

## 2021-03-19 ENCOUNTER — Other Ambulatory Visit: Payer: Self-pay

## 2021-03-19 ENCOUNTER — Ambulatory Visit: Payer: PPO | Admitting: Adult Health

## 2021-03-19 ENCOUNTER — Encounter: Payer: Self-pay | Admitting: Adult Health

## 2021-03-19 VITALS — BP 144/83 | HR 70 | Ht 63.0 in | Wt 165.5 lb

## 2021-03-19 DIAGNOSIS — N814 Uterovaginal prolapse, unspecified: Secondary | ICD-10-CM

## 2021-03-19 DIAGNOSIS — M25473 Effusion, unspecified ankle: Secondary | ICD-10-CM

## 2021-03-19 DIAGNOSIS — Z4689 Encounter for fitting and adjustment of other specified devices: Secondary | ICD-10-CM | POA: Diagnosis not present

## 2021-03-19 MED ORDER — HYDROCHLOROTHIAZIDE 12.5 MG PO CAPS: ORAL_CAPSULE | ORAL | 1 refills | Status: DC

## 2021-03-19 NOTE — Progress Notes (Signed)
  Subjective:     Patient ID: Misty Wilson, female   DOB: June 27, 1941, 80 y.o.   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 She is still working.   Review of Systems She denies any vaginal discharge, bleeding or odor +swelling in right ankel   Reviewed past medical,surgical, social and family history. Reviewed medications and allergies.  Objective:   Physical Exam BP (!) 144/83 (BP Location: Right Arm, Patient Position: Sitting, Cuff Size: Normal)   Pulse 70   Ht 5\' 3"  (1.6 m)   Wt 165 lb 8 oz (75.1 kg)   BMI 29.32 kg/m  Skin warm and dry.Pelvic: external genitalia is normal in appearance no lesions,has clit ring, vagina:gelhorn pessary easily removed and cleaned with soap and water, no lesions in vaginal +cystocele,pessary reinserted,urethra has no lesions or masses noted, cervix:smooth.   Has some swelling right ankle, non pitting  Upstream - 03/19/21 0914      Pregnancy Intention Screening   Does the patient want to become pregnant in the next year? No    Does the patient's partner want to become pregnant in the next year? No    Would the patient like to discuss contraceptive options today? No      Contraception Wrap Up   Current Method Female Sterilization    End Method Female Sterilization    Contraception Counseling Provided No         Examination chaperoned by Estill Bamberg LPN Assessment:     1. Pessary maintenance  2. Cystocele with uterine prolapse   3. Ankle swelling, unspecified laterality Can try Microzide as needed     Meds ordered this encounter  Medications  . hydrochlorothiazide (MICROZIDE) 12.5 MG capsule    Sig: Take 1 daily    Dispense:  30 capsule    Refill:  1    Order Specific Question:   Supervising Provider    Answer:   Florian Buff [[id number]]   Plan:     Follow up in 6 months

## 2021-04-07 DIAGNOSIS — R87610 Atypical squamous cells of undetermined significance on cytologic smear of cervix (ASC-US): Secondary | ICD-10-CM | POA: Diagnosis not present

## 2022-04-28 ENCOUNTER — Other Ambulatory Visit: Payer: PPO | Admitting: Adult Health

## 2022-05-09 ENCOUNTER — Ambulatory Visit: Payer: PPO | Admitting: Orthopedic Surgery

## 2022-05-09 ENCOUNTER — Encounter: Payer: Self-pay | Admitting: Orthopedic Surgery

## 2022-05-09 VITALS — BP 144/84 | HR 73 | Ht 65.0 in | Wt 173.2 lb

## 2022-05-09 DIAGNOSIS — S76911A Strain of unspecified muscles, fascia and tendons at thigh level, right thigh, initial encounter: Secondary | ICD-10-CM | POA: Diagnosis not present

## 2022-05-09 NOTE — Progress Notes (Signed)
Chief Complaint  Patient presents with   New Patient (Initial Visit)   Hip Pain    RT/ started hurting after she spent several hours on her knees planting flowers    HPI: Misty Wilson is seeing [place] for right thigh pain actually.  She did some exercises working out and then planted some flowers being in the position where her knees and hips were flexed and she started having anterior thigh pain medial thigh pain and some lateral thigh pain no back pain  Past Medical History:  Diagnosis Date   Breast mass, left 04/25/2013   Multiple gastric ulcers     BP (!) 144/84   Pulse 73   Ht '5\' 5"'$  (1.651 m)   Wt 173 lb 3.2 oz (78.6 kg)   BMI 28.82 kg/m    General appearance: Well-developed well-nourished no gross deformities  Cardiovascular normal pulse and perfusion normal color without edema  Neurologically no sensation loss or deficits or pathologic reflexes  Psychological: Awake alert and oriented x3 mood and affect normal  Skin no lacerations or ulcerations no nodularity no palpable masses, no erythema or nodularity  Musculoskeletal: She is ambulatory without assistive device she has normal range of motion in her hips she has tenderness in the quadriceps primarily and then in the medial abductors and slightly on the lateral side of the hip no back pain or tenderness  Imaging no imaging  A/P  Strain right leg and hip  Continue ibuprofen as needed  Reevaluate if no improvement after 2 weeks

## 2022-05-09 NOTE — Patient Instructions (Signed)
You have strained your thigh muscle. You can take Ibuprofen as needed as you have been doing. If your pain changes or worsens please call us.

## 2022-05-13 ENCOUNTER — Encounter: Payer: Self-pay | Admitting: Emergency Medicine

## 2022-05-13 ENCOUNTER — Ambulatory Visit
Admission: EM | Admit: 2022-05-13 | Discharge: 2022-05-13 | Disposition: A | Discharge status: Home / Self Care | Payer: PPO | Attending: Family Medicine | Admitting: Family Medicine

## 2022-05-13 DIAGNOSIS — M25551 Pain in right hip: Secondary | ICD-10-CM | POA: Diagnosis not present

## 2022-05-13 MED ORDER — DEXAMETHASONE SODIUM PHOSPHATE 10 MG/ML IJ SOLN
10.0000 mg | Freq: Once | INTRAMUSCULAR | Status: AC
Start: 2022-05-13 — End: 2022-05-13
  Administered 2022-05-13: 10 mg via INTRAMUSCULAR

## 2022-05-13 NOTE — Discharge Instructions (Signed)
Follow-up with orthopedics next week if not improving significantly

## 2022-05-13 NOTE — ED Triage Notes (Signed)
Right hip pain x 6 to 7 weeks after being on hands and knees for a prolonged period of time.  Saw orthopedists on Monday and was told to take ibuprofen.  No relief with ibuprofen.

## 2022-05-13 NOTE — ED Provider Notes (Signed)
[facility]    CSN: [id number] Arrival date & time: 05/13/22  1142      History   Chief Complaint No chief complaint on file.   HPI Misty Wilson is a 81 y.o. female.   Presenting today with about 6 weeks of right lateral hip pain, soreness, stiffness that has been present since doing a lot of gardening on her hands and knees just prior to onset.  She denies any decreased range of motion, weakness, numbness, tingling, swelling, discoloration, radiation of pain.  Saw orthopedics Monday of this week and was told to take ibuprofen which has not been helping.  She is also been trying lidocaine, heat, ice, stretches with no relief.  States she is a very active person and does not typically have hip pain like this.   Past Medical History:  Diagnosis Date   Breast mass, left 04/25/2013   Multiple gastric ulcers     Patient Active Problem List   Diagnosis Date Noted   Pessary maintenance 03/19/2021   Ankle swelling 03/19/2021   FH: colon cancer 07/21/2020   Cystocele with uterine prolapse 04/07/2020   Screening for colorectal cancer 04/07/2020   Encounter for fitting and adjustment of pessary 04/07/2020   Elevated cholesterol with elevated triglycerides 09/18/2018   Elevated BP without diagnosis of hypertension 09/18/2018   History of colonic polyps    Diverticulosis of colon without hemorrhage    Breast mass, left 04/25/2013   TIBIALIS TENDINITIS 04/27/2009   FOOT PAIN, RIGHT 11/20/2008    Past Surgical History:  Procedure Laterality Date   AUGMENTATION MAMMAPLASTY     CATARACT EXTRACTION W/PHACO Right 11/23/2020   Procedure: CATARACT EXTRACTION PHACO AND INTRAOCULAR LENS PLACEMENT RIGHT EYE;  Surgeon: [name], MD;  Location: [facility];  Service: Ophthalmology;  Laterality: Right;  right CDE=28.15   CATARACT EXTRACTION W/PHACO Left 12/14/2020   Procedure: CATARACT EXTRACTION PHACO AND INTRAOCULAR LENS PLACEMENT (IOC);  Surgeon: [name], MD;   Location: [facility];  Service: Ophthalmology;  Laterality: Left;  CDE: 20.82   CHOLECYSTECTOMY     COLONOSCOPY N/A 04/06/2015   Procedure: COLONOSCOPY;  Surgeon: Daneil Dolin, MD;  Location: [facility];  Service: Endoscopy;  Laterality: N/A;  8:45   COLONOSCOPY N/A 09/02/2020   Procedure: COLONOSCOPY;  Surgeon: Daneil Dolin, MD;  Location: [facility];  Service: Endoscopy;  Laterality: N/A;  1:00pm   EXCISION MORTON'S NEUROMA Left 12/09/2015   Procedure: EXCISION NEUROMA LEFT FOOT;  Surgeon: Caprice Beaver, DPM;  Location: [facility];  Service: Podiatry;  Laterality: Left;   POLYPECTOMY  09/02/2020   Procedure: POLYPECTOMY;  Surgeon: Daneil Dolin, MD;  [facility]: [facility];  Service: Endoscopy;;   TUBAL LIGATION     veins     had varicose veins done    OB History     Gravida  4   Para  4   Term      Preterm      AB      Living         SAB      IAB      Ectopic      Multiple      Live Births               Home Medications    Prior to Admission medications   Medication Sig Start Date End Date Taking? Authorizing Provider  Ascorbic Acid (VITAMIN C) 1000 MG tablet Take 2,000 mg by mouth daily.  [provider]  aspirin EC 81 MG tablet Take 81 mg by mouth every evening. Swallow whole.    [provider]  Calcium-Magnesium-Vitamin D (CALCIUM 1200+D3 PO) Take 1 tablet by mouth daily.    [provider]  cholecalciferol (VITAMIN D) 25 MCG (1000 UNIT) tablet Take 1,000 Units by mouth daily.    [provider]  Ginkgo Biloba 120 MG CAPS Take 120 mg by mouth daily.    [provider]  hydrochlorothiazide (MICROZIDE) 12.5 MG capsule Take 1 daily 03/19/21   Derrek Monaco A, NP  ibuprofen (ADVIL,MOTRIN) 200 MG tablet Take 400 mg by mouth every 8 (eight) hours as needed for moderate pain (knee pain.).     [provider]  Javier Docker Oil 350 MG CAPS Take 350 mg by mouth daily.    [provider]  Turmeric  500 MG CAPS Take 5,000 mg by mouth daily.    [provider]  vitamin B-12 (CYANOCOBALAMIN) 1000 MCG tablet Take 1,000 mcg by mouth daily.    [provider]    Family History Family History  Problem Relation Age of Onset   Cancer Mother        colon   Other Mother        aneursym   Cancer Brother        colon   Cancer Sister        bone,breast    Social History Social History   Tobacco Use   Smoking status: Light Smoker    Packs/day: 0.10    Years: 12.00    Total pack years: 1.20    Types: Cigarettes   Smokeless tobacco: Never   Tobacco comments:    smokes 1 pack per week  Vaping Use   Vaping Use: Never used  Substance Use Topics   Alcohol use: No   Drug use: No     Allergies   Codeine, Other, and Prednisone   Review of Systems Review of Systems PER HPI  Physical Exam Triage Vital Signs ED Triage Vitals  Enc Vitals Group     BP 05/13/22 1150 (!) 164/69     Pulse Rate 05/13/22 1150 69     Resp 05/13/22 1150 18     Temp 05/13/22 1150 98.4 F (36.9 C)     Temp Source 05/13/22 1150 Oral     SpO2 05/13/22 1150 95 %     Weight --      Height --      Head Circumference --      Peak Flow --      Pain Score 05/13/22 1152 3     Pain Loc --      Pain Edu? --      Excl. in [place]? --    No data found.  Updated Vital Signs BP (!) 164/69 (BP Location: Right Arm)   Pulse 69   Temp 98.4 F (36.9 C) (Oral)   Resp 18   SpO2 95%   Visual Acuity Right Eye Distance:   Left Eye Distance:   Bilateral Distance:    Right Eye Near:   Left Eye Near:    Bilateral Near:     Physical Exam Vitals and nursing note reviewed.  Constitutional:      Appearance: Normal appearance. She is not ill-appearing.  HENT:     Head: Atraumatic.     Mouth/Throat:     Mouth: Mucous membranes are moist.  Eyes:     Extraocular Movements: Extraocular movements intact.  Conjunctiva/sclera: Conjunctivae normal.  Cardiovascular:     Rate and Rhythm: Normal  rate and regular rhythm.     Heart sounds: Normal heart sounds.  Pulmonary:     Effort: Pulmonary effort is normal.     Breath sounds: Normal breath sounds.  Musculoskeletal:        General: Tenderness present. No swelling or deformity. Normal range of motion.     Cervical back: Normal range of motion and neck supple.     Comments: Mild right lateral hip tenderness to palpation.  Range of motion, strength fully intact, normal gait, negative straight leg raise, no midline spinal tenderness to palpation diffusely.  Skin:    General: Skin is warm and dry.     Findings: No bruising or erythema.  Neurological:     Mental Status: She is alert and oriented to person, place, and time.     Motor: No weakness.     Gait: Gait normal.  Psychiatric:        Mood and Affect: Mood normal.        Thought Content: Thought content normal.        Judgment: Judgment normal.      UC Treatments / Results  Labs (all labs ordered are listed, but only abnormal results are displayed) Labs Reviewed - No data to display  EKG   Radiology No results found.  Procedures Procedures (including critical care time)  Medications Ordered in UC Medications  dexamethasone (DECADRON) injection 10 mg (has no administration in time range)    Initial Impression / Assessment and Plan / UC Course  I have reviewed the triage vital signs and the nursing notes.  Pertinent labs & imaging results that were available during my care of the patient were reviewed by me and considered in my medical decision making (see chart for details).     Given resistance to over-the-counter pain relievers, supportive measures and rest we will treat with IM Decadron and have close follow-up with orthopedics.  Return for worsening symptoms.  Shared decision making used to defer x-ray imaging today.  Final Clinical Impressions(s) / UC Diagnoses   Final diagnoses:  Right hip pain     Discharge Instructions      Follow-up with  orthopedics next week if not improving significantly    ED Prescriptions   None    PDMP not reviewed this encounter.   Volney American, Misty 05/13/22 1216

## 2022-06-17 ENCOUNTER — Ambulatory Visit
Admission: EM | Admit: 2022-06-17 | Discharge: 2022-06-17 | Disposition: A | Discharge status: Home / Self Care | Payer: PPO | Attending: Nurse Practitioner | Admitting: Nurse Practitioner

## 2022-06-17 ENCOUNTER — Encounter: Payer: Self-pay | Admitting: *Deleted

## 2022-06-17 DIAGNOSIS — M79675 Pain in left toe(s): Secondary | ICD-10-CM

## 2022-06-17 MED ORDER — DEXAMETHASONE SODIUM PHOSPHATE 10 MG/ML IJ SOLN
10.0000 mg | INTRAMUSCULAR | Status: AC
  Administered 2022-06-17: 10 mg via INTRAMUSCULAR

## 2022-06-17 MED ORDER — KETOROLAC TROMETHAMINE 30 MG/ML IJ SOLN
30.0000 mg | Freq: Once | INTRAMUSCULAR | Status: AC
  Administered 2022-06-17: 30 mg via INTRAMUSCULAR

## 2022-06-17 NOTE — ED Provider Notes (Signed)
[facility]    CSN: [id number] Arrival date & time: 06/17/22  1918      History   Chief Complaint Chief Complaint  Patient presents with   Toe Pain    HPI Misty Wilson is a 81 y.o. female.   The history is provided by the patient.   Patient presents for complaints of left great toe pain that started today while she was working.  Patient denies any known injury or trauma.  Patient complains of pain at the joint of the left great toe.  She states that weightbearing worsens her symptoms.  She is able to move the toe/foot without difficulty.  She denies any decreased range of motion, weakness, numbness, tingling, swelling, discoloration, radiation of pain. She denies any previous diagnosis of gout.  Patient has taken ibuprofen for her symptoms.  Past Medical History:  Diagnosis Date   Breast mass, left 04/25/2013   Multiple gastric ulcers     Patient Active Problem List   Diagnosis Date Noted   Pessary maintenance 03/19/2021   Ankle swelling 03/19/2021   FH: colon cancer 07/21/2020   Cystocele with uterine prolapse 04/07/2020   Screening for colorectal cancer 04/07/2020   Encounter for fitting and adjustment of pessary 04/07/2020   Elevated cholesterol with elevated triglycerides 09/18/2018   Elevated BP without diagnosis of hypertension 09/18/2018   History of colonic polyps    Diverticulosis of colon without hemorrhage    Breast mass, left 04/25/2013   TIBIALIS TENDINITIS 04/27/2009   FOOT PAIN, RIGHT 11/20/2008    Past Surgical History:  Procedure Laterality Date   AUGMENTATION MAMMAPLASTY     CATARACT EXTRACTION W/PHACO Right 11/23/2020   Procedure: CATARACT EXTRACTION PHACO AND INTRAOCULAR LENS PLACEMENT RIGHT EYE;  Surgeon: [name], MD;  Location: [facility];  Service: Ophthalmology;  Laterality: Right;  right CDE=28.15   CATARACT EXTRACTION W/PHACO Left 12/14/2020   Procedure: CATARACT EXTRACTION PHACO AND INTRAOCULAR LENS PLACEMENT (IOC);   Surgeon: [name], MD;  Location: [facility];  Service: Ophthalmology;  Laterality: Left;  CDE: 20.82   CHOLECYSTECTOMY     COLONOSCOPY N/A 04/06/2015   Procedure: COLONOSCOPY;  Surgeon: Daneil Dolin, MD;  [facility];  Service: Endoscopy;  Laterality: N/A;  8:45   COLONOSCOPY N/A 09/02/2020   Procedure: COLONOSCOPY;  Surgeon: Daneil Dolin, MD;  [facility];  Service: Endoscopy;  Laterality: N/A;  1:00pm   EXCISION MORTON'S NEUROMA Left 12/09/2015   Procedure: EXCISION NEUROMA LEFT FOOT;  Surgeon: Caprice Beaver, DPM;  Location: [facility];  Service: Podiatry;  Laterality: Left;   POLYPECTOMY  09/02/2020   Procedure: POLYPECTOMY;  Surgeon: Daneil Dolin, MD;  [facility];  Service: Endoscopy;;   TUBAL LIGATION     veins     had varicose veins done    OB History     Gravida  4   Para  4   Term      Preterm      AB      Living         SAB      IAB      Ectopic      Multiple      Live Births               Home Medications    Prior to Admission medications   Medication Sig Start Date End Date Taking? Authorizing Provider  Ascorbic Acid (VITAMIN C) 1000 MG tablet Take 2,000  mg by mouth daily.   Yes [provider]  aspirin EC 81 MG tablet Take 81 mg by mouth every evening. Swallow whole.   Yes [provider]  Calcium-Magnesium-Vitamin D (CALCIUM 1200+D3 PO) Take 1 tablet by mouth daily.   Yes [provider]  cholecalciferol (VITAMIN D) 25 MCG (1000 UNIT) tablet Take 1,000 Units by mouth daily.   Yes [provider]  Ginkgo Biloba 120 MG CAPS Take 120 mg by mouth daily.   Yes [provider]  hydrochlorothiazide (MICROZIDE) 12.5 MG capsule Take 1 daily 03/19/21  Yes Derrek Monaco A, NP  ibuprofen (ADVIL,MOTRIN) 200 MG tablet Take 400 mg by mouth every 8 (eight) hours as needed for moderate pain (knee pain.).    Yes [provider]  Javier Docker Oil 350 MG CAPS Take 350 mg by  mouth daily.   Yes [provider]  Turmeric 500 MG CAPS Take 5,000 mg by mouth daily.   Yes [provider]  vitamin B-12 (CYANOCOBALAMIN) 1000 MCG tablet Take 1,000 mcg by mouth daily.   Yes [provider]    Family History Family History  Problem Relation Age of Onset   Cancer Mother        colon   Other Mother        aneursym   Cancer Brother        colon   Cancer Sister        bone,breast    Social History Social History   Tobacco Use   Smoking status: Light Smoker    Packs/day: 0.10    Years: 12.00    Total pack years: 1.20    Types: Cigarettes   Smokeless tobacco: Never   Tobacco comments:    smokes 1 pack per week  Vaping Use   Vaping Use: Never used  Substance Use Topics   Alcohol use: No   Drug use: No     Allergies   Codeine, Other, and Prednisone   Review of Systems Review of Systems Per HPI  Physical Exam Triage Vital Signs ED Triage Vitals  Enc Vitals Group     BP 06/17/22 1925 (!) 162/89     Pulse Rate 06/17/22 1925 69     Resp 06/17/22 1925 18     Temp 06/17/22 1925 98.6 F (37 C)     Temp Source 06/17/22 1925 Oral     SpO2 06/17/22 1925 96 %     Weight --      Height --      Head Circumference --      Peak Flow --      Pain Score 06/17/22 1923 10     Pain Loc --      Pain Edu? --      Excl. in [place]? --    No data found.  Updated Vital Signs BP (!) 162/89 (BP Location: Right Arm)   Pulse 69   Temp 98.6 F (37 C) (Oral)   Resp 18   SpO2 96%   Visual Acuity Right Eye Distance:   Left Eye Distance:   Bilateral Distance:    Right Eye Near:   Left Eye Near:    Bilateral Near:     Physical Exam Vitals and nursing note reviewed.  Constitutional:      Appearance: Normal appearance.  Eyes:     Extraocular Movements: Extraocular movements intact.     Conjunctiva/sclera: Conjunctivae normal.     Pupils: Pupils are equal, round, and reactive to  light.  Pulmonary:     Effort: Pulmonary effort is  normal.  Musculoskeletal:     Cervical back: Normal range of motion.     Left foot: Normal capillary refill. Swelling and tenderness present. No deformity or bunion. Normal pulse.  Feet:     Left foot:     Skin integrity: Skin integrity normal. No ulcer, blister, skin breakdown, erythema or warmth.     Toenail Condition: Left toenails are normal.  Skin:    General: Skin is warm and dry.  Neurological:     General: No focal deficit present.     Mental Status: She is alert and oriented to person, place, and time.  Psychiatric:        Mood and Affect: Mood normal.        Behavior: Behavior normal.      UC Treatments / Results  Labs (all labs ordered are listed, but only abnormal results are displayed) Labs Reviewed - No data to display  EKG   Radiology No results found.  Procedures Procedures (including critical care time)  Medications Ordered in UC Medications  dexamethasone (DECADRON) injection 10 mg (10 mg Intramuscular Given 06/17/22 1949)  ketorolac (TORADOL) 30 MG/ML injection 30 mg (30 mg Intramuscular Given 06/17/22 1949)    Initial Impression / Assessment and Plan / UC Course  I have reviewed the triage vital signs and the nursing notes.  Pertinent labs & imaging results that were available during my care of the patient were reviewed by me and considered in my medical decision making (see chart for details).  Given resistance to over-the-counter pain relievers, supportive measures and rest we will treat with IM Decadron and Toradol and have close follow-up with orthopedics.  Return for worsening symptoms.  Shared decision making used to defer x-ray imaging today. Final Clinical Impressions(s) / UC Diagnoses   Final diagnoses:  Great toe pain, left     Discharge Instructions      You have been given Decadron 10 mg intramuscularly and Toradol 30 mg intramuscularly for your pain. Continue to monitor your blood pressure for sudden increase or elevation. Take  Tylenol arthritis strength 650 mg tablet every 8 hours to help with pain or discomfort. Apply ice to the affected area to help with pain and swelling.  Apply for 20 minutes, remove for 1 hour, then repeat. Wear comfortable shoes when working.  It may also help to wear compression socks. If symptoms fail to improve, please follow-up in this clinic or with orthopedics.  She can follow-up with [facility] at [phone] or [facility] at [phone].    ED Prescriptions   None    PDMP not reviewed this encounter.   Tish Men, NP 06/17/22 2032

## 2022-06-17 NOTE — ED Triage Notes (Signed)
Patient states that her left great toe started hurting today walks on all day. Took IBU and used Voltaren gel without relief.

## 2022-06-17 NOTE — Discharge Instructions (Addendum)
You have been given Decadron 10 mg intramuscularly and Toradol 30 mg intramuscularly for your pain. Continue to monitor your blood pressure for sudden increase or elevation. Take Tylenol arthritis strength 650 mg tablet every 8 hours to help with pain or discomfort. Apply ice to the affected area to help with pain and swelling.  Apply for 20 minutes, remove for 1 hour, then repeat. Wear comfortable shoes when working.  It may also help to wear compression socks. If symptoms fail to improve, please follow-up in this clinic or with orthopedics.  She can follow-up with [facility] [place] at [phone] or [facility] at [phone].

## 2022-07-08 ENCOUNTER — Other Ambulatory Visit: Payer: Self-pay | Admitting: Adult Health

## 2022-07-12 ENCOUNTER — Ambulatory Visit (INDEPENDENT_AMBULATORY_CARE_PROVIDER_SITE_OTHER): Payer: PPO | Admitting: Family Medicine

## 2022-07-12 ENCOUNTER — Encounter: Payer: Self-pay | Admitting: Family Medicine

## 2022-07-12 VITALS — BP 132/73 | HR 74 | Ht 65.0 in | Wt 170.1 lb

## 2022-07-12 DIAGNOSIS — Z2821 Immunization not carried out because of patient refusal: Secondary | ICD-10-CM

## 2022-07-12 DIAGNOSIS — E559 Vitamin D deficiency, unspecified: Secondary | ICD-10-CM | POA: Diagnosis not present

## 2022-07-12 DIAGNOSIS — R7301 Impaired fasting glucose: Secondary | ICD-10-CM | POA: Diagnosis not present

## 2022-07-12 DIAGNOSIS — R030 Elevated blood-pressure reading, without diagnosis of hypertension: Secondary | ICD-10-CM | POA: Diagnosis not present

## 2022-07-12 NOTE — Progress Notes (Signed)
New Patient Office Visit  Subjective:  Patient ID: Misty Wilson, female    DOB: [birth year]  Age: 81 y.o. MRN: [id number]  CC:  Chief Complaint  Patient presents with   New Patient (Initial Visit)    Establishing care, has not had a PCP before usually sees obgyn for health needs.     HPI Misty Wilson is a 81 y.o. female with past medical history of right foot pain, left breast mass, tibialis tendinitis  presents for establishing care with no complaints or concerns. She reports taking baby aspirin but has no hx of MI, stroke, or PAD.   Past Medical History:  Diagnosis Date   Breast mass, left 04/25/2013   Multiple gastric ulcers     Past Surgical History:  Procedure Laterality Date   AUGMENTATION MAMMAPLASTY     CATARACT EXTRACTION W/PHACO Right 11/23/2020   Procedure: CATARACT EXTRACTION PHACO AND INTRAOCULAR LENS PLACEMENT RIGHT EYE;  Surgeon: [name], MD;  Location: [facility];  Service: Ophthalmology;  Laterality: Right;  right CDE=28.15   CATARACT EXTRACTION W/PHACO Left 12/14/2020   Procedure: CATARACT EXTRACTION PHACO AND INTRAOCULAR LENS PLACEMENT (IOC);  Surgeon: [name], MD;  Location: [facility];  Service: Ophthalmology;  Laterality: Left;  CDE: 20.82   CHOLECYSTECTOMY     COLONOSCOPY N/A 04/06/2015   Procedure: COLONOSCOPY;  Surgeon: Daneil Dolin, MD;  [facility];  Service: Endoscopy;  Laterality: N/A;  8:45   COLONOSCOPY N/A 09/02/2020   Procedure: COLONOSCOPY;  Surgeon: Daneil Dolin, MD;  [facility];  Service: Endoscopy;  Laterality: N/A;  1:00pm   EXCISION MORTON'S NEUROMA Left 12/09/2015   Procedure: EXCISION NEUROMA LEFT FOOT;  Surgeon: Caprice Beaver, DPM;  Location: [facility];  Service: Podiatry;  Laterality: Left;   POLYPECTOMY  09/02/2020   Procedure: POLYPECTOMY;  Surgeon: Daneil Dolin, MD;  [facility];  Service: Endoscopy;;   TUBAL LIGATION     veins     had varicose veins done    Family History   Problem Relation Age of Onset   Cancer Mother        colon   Other Mother        aneursym   Cancer Brother        colon   Cancer Sister        bone,breast    Social History   Socioeconomic History   Marital status: Divorced    Spouse name: Not on file   Number of children: Not on file   Years of education: Not on file   Highest education level: Not on file  Occupational History   Not on file  Tobacco Use   Smoking status: Light Smoker    Packs/day: 0.10    Years: 12.00    Total pack years: 1.20    Types: Cigarettes   Smokeless tobacco: Never   Tobacco comments:    smokes 1 pack per week  Vaping Use   Vaping Use: Never used  Substance and Sexual Activity   Alcohol use: No   Drug use: No   Sexual activity: Not Currently    Birth control/protection: Post-menopausal, Surgical    Comment: tubal  Other Topics Concern   Not on file  Social History Narrative   Not on file   Social Determinants of Health   Financial Resource Strain: Low Risk  (04/07/2020)   Overall Financial Resource Strain (CARDIA)    Difficulty of Paying Living Expenses: Not hard  at all  Food Insecurity: No Food Insecurity (04/07/2020)   Hunger Vital Sign    Worried About Running Out of Food in the Last Year: Never true    Ran Out of Food in the Last Year: Never true  Transportation Needs: No Transportation Needs (04/07/2020)   PRAPARE - Hydrologist (Medical): No    Lack of Transportation (Non-Medical): No  Physical Activity: Insufficiently Active (04/07/2020)   Exercise Vital Sign    Days of Exercise per Week: 4 days    Minutes of Exercise per Session: 20 min  Stress: No Stress Concern Present (04/07/2020)   [place]    Feeling of Stress : Not at all  Social Connections: Moderately Isolated (04/07/2020)   Social Connection and Isolation Panel [NHANES]    Frequency of Communication with Friends and Family:  More than three times a week    Frequency of Social Gatherings with Friends and Family: Twice a week    Attends Religious Services: 1 to 4 times per year    Active Member of [employer] or Organizations: No    Attends Archivist Meetings: Never    Marital Status: Divorced  Human resources officer Violence: Not At Risk (04/07/2020)   Humiliation, Afraid, Rape, and Kick questionnaire    Fear of Current or Ex-Partner: No    Emotionally Abused: No    Physically Abused: No    Sexually Abused: No    ROS Review of Systems  Constitutional:  Negative for chills, fatigue and fever.  HENT:  Negative for rhinorrhea, sinus pressure and sinus pain.   Eyes:  Negative for pain, redness and visual disturbance.  Respiratory:  Negative for chest tightness and shortness of breath.   Cardiovascular:  Negative for chest pain and palpitations.  Gastrointestinal:  Negative for nausea and vomiting.  Endocrine: Negative for polydipsia, polyphagia and polyuria.  Genitourinary:  Negative for vaginal discharge and vaginal pain.  Musculoskeletal:  Negative for gait problem and neck pain.  Skin:  Negative for rash and wound.  Neurological:  Negative for dizziness, speech difficulty and headaches.  Psychiatric/Behavioral:  Negative for self-injury and suicidal ideas.     Objective:   Today's Vitals: BP 132/73   Pulse 74   Ht '5\' 5"'  (1.651 m)   Wt 170 lb 1.9 oz (77.2 kg)   SpO2 96%   BMI 28.31 kg/m   Physical Exam HENT:     Head: Normocephalic.     Right Ear: External ear normal.     Left Ear: External ear normal.     Nose: No congestion or rhinorrhea.     Mouth/Throat:     Mouth: Mucous membranes are moist.  Eyes:     Extraocular Movements: Extraocular movements intact.     Pupils: Pupils are equal, round, and reactive to light.  Cardiovascular:     Rate and Rhythm: Normal rate and regular rhythm.     Pulses: Normal pulses.     Heart sounds: Normal heart sounds.  Pulmonary:     Effort: Pulmonary  effort is normal.     Breath sounds: Normal breath sounds.  Abdominal:     Palpations: Abdomen is soft.     Tenderness: There is no right CVA tenderness or left CVA tenderness.  Musculoskeletal:     Cervical back: No rigidity.     Right lower leg: No edema.     Left lower leg: No edema.  Skin:  Findings: No lesion or rash.  Neurological:     Mental Status: She is alert and oriented to person, place, and time.  Psychiatric:     Comments: Normal affect     Assessment & Plan:   Problem List Items Addressed This Visit       Other   Elevated BP without diagnosis of hypertension    Controlled Denies headaches, dizziness and blurred vision She reports taking baby aspirin but has no hx of MI, stroke, or PAD Informed patient that she does not need to take aspirin if she does not have a hx of MI, stroke, or PAD.      Other Visit Diagnoses     Influenza vaccine refused    -  Primary   Refuses tetanus, diphtheria, and acellular pertussis (Tdap) vaccination       Refused pneumococcal vaccination       Refused varicella vaccine       IFG (impaired fasting glucose)       Relevant Orders   CBC with Differential/Platelet   CMP14+EGFR   TSH + free T4   Lipid Profile   Hemoglobin A1C   Vitamin D deficiency       Relevant Orders   Vitamin D (25 hydroxy)       Outpatient Encounter Medications as of 07/12/2022  Medication Sig   Ascorbic Acid (VITAMIN C) 1000 MG tablet Take 2,000 mg by mouth daily.   aspirin EC 81 MG tablet Take 81 mg by mouth every evening. Swallow whole.   Calcium-Magnesium-Vitamin D (CALCIUM 1200+D3 PO) Take 1 tablet by mouth daily.   cholecalciferol (VITAMIN D) 25 MCG (1000 UNIT) tablet Take 1,000 Units by mouth daily.   Ginkgo Biloba 120 MG CAPS Take 120 mg by mouth daily.   hydrochlorothiazide (MICROZIDE) 12.5 MG capsule TAKE ONE CAPSULE BY MOUTH DAILY   ibuprofen (ADVIL,MOTRIN) 200 MG tablet Take 400 mg by mouth every 8 (eight) hours as needed for  moderate pain (knee pain.).    Krill Oil 350 MG CAPS Take 350 mg by mouth daily.   Turmeric 500 MG CAPS Take 5,000 mg by mouth daily.   vitamin B-12 (CYANOCOBALAMIN) 1000 MCG tablet Take 1,000 mcg by mouth daily.   No facility-administered encounter medications on file as of 07/12/2022.    Follow-up: Return in about 3 months (around 10/11/2022).   Alvira Monday, FNP

## 2022-07-12 NOTE — Patient Instructions (Signed)
I appreciate the opportunity to provide care to you today!    Follow up:  3 months  Labs: please stop by the lab during the week to get your blood drawn (CBC, CMP, TSH, Lipid profile, HgA1c, Vit D)     Please continue to a heart-healthy diet and increase your physical activities. Try to exercise for 47mns at least three times a week.      It was a pleasure to see you and I look forward to continuing to work together on your health and well-being. Please do not hesitate to call the office if you need care or have questions about your care.   Have a wonderful day and week. With Gratitude, GAlvira MondayMSN, FNP-BC

## 2022-07-12 NOTE — Assessment & Plan Note (Signed)
Controlled Denies headaches, dizziness and blurred vision She reports taking baby aspirin but has no hx of MI, stroke, or PAD Informed patient that she does not need to take aspirin if she does not have a hx of MI, stroke, or PAD.

## 2022-07-14 LAB — CMP14+EGFR
ALT: 15 IU/L (ref 0–32)
AST: 16 IU/L (ref 0–40)
Albumin/Globulin Ratio: 2 (ref 1.2–2.2)
Albumin: 4.3 g/dL (ref 3.8–4.8)
Alkaline Phosphatase: 73 IU/L (ref 44–121)
BUN/Creatinine Ratio: 25 (ref 12–28)
BUN: 16 mg/dL (ref 8–27)
Bilirubin Total: 0.3 mg/dL (ref 0.0–1.2)
CO2: 21 mmol/L (ref 20–29)
Calcium: 9.3 mg/dL (ref 8.7–10.3)
Chloride: 105 mmol/L (ref 96–106)
Creatinine, Ser: 0.65 mg/dL (ref 0.57–1.00)
Globulin, Total: 2.1 g/dL (ref 1.5–4.5)
Glucose: 104 mg/dL — ABNORMAL HIGH (ref 70–99)
Potassium: 4.6 mmol/L (ref 3.5–5.2)
Sodium: 140 mmol/L (ref 134–144)
Total Protein: 6.4 g/dL (ref 6.0–8.5)
eGFR: 89 mL/min/{1.73_m2} (ref >59)

## 2022-07-14 LAB — CBC WITH DIFFERENTIAL/PLATELET
Basophils Absolute: 0 10*3/uL (ref 0.0–0.2)
Basos: 0 %
EOS (ABSOLUTE): 0.1 10*3/uL (ref 0.0–0.4)
Eos: 2 %
Hematocrit: 37.9 % (ref 34.0–46.6)
Hemoglobin: 12.5 g/dL (ref 11.1–15.9)
Immature Grans (Abs): 0 10*3/uL (ref 0.0–0.1)
Immature Granulocytes: 0 %
Lymphocytes Absolute: 1.6 10*3/uL (ref 0.7–3.1)
Lymphs: 24 %
MCH: 30 pg (ref 26.6–33.0)
MCHC: 33 g/dL (ref 31.5–35.7)
MCV: 91 fL (ref 79–97)
Monocytes Absolute: 0.4 10*3/uL (ref 0.1–0.9)
Monocytes: 6 %
Neutrophils Absolute: 4.6 10*3/uL (ref 1.4–7.0)
Neutrophils: 68 %
Platelets: 237 10*3/uL (ref 150–450)
RBC: 4.17 x10E6/uL (ref 3.77–5.28)
RDW: 12.9 % (ref 11.7–15.4)
WBC: 6.7 10*3/uL (ref 3.4–10.8)

## 2022-07-14 LAB — LIPID PANEL
Chol/HDL Ratio: 4.9 ratio — ABNORMAL HIGH (ref 0.0–4.4)
Cholesterol, Total: 162 mg/dL (ref 100–199)
HDL: 33 mg/dL — ABNORMAL LOW (ref >39)
LDL Chol Calc (NIH): 111 mg/dL — ABNORMAL HIGH (ref 0–99)
Triglycerides: 98 mg/dL (ref 0–149)
VLDL Cholesterol Cal: 18 mg/dL (ref 5–40)

## 2022-07-14 LAB — HEMOGLOBIN A1C
Est. average glucose Bld gHb Est-mCnc: 126 mg/dL
Hgb A1c MFr Bld: 6 % — ABNORMAL HIGH (ref 4.8–5.6)

## 2022-07-14 LAB — TSH+FREE T4
Free T4: 1.25 ng/dL (ref 0.82–1.77)
TSH: 2.47 u[IU]/mL (ref 0.450–4.500)

## 2022-07-14 LAB — VITAMIN D 25 HYDROXY (VIT D DEFICIENCY, FRACTURES): Vit D, 25-Hydroxy: 30.2 ng/mL (ref 30.0–100.0)

## 2022-07-14 NOTE — Progress Notes (Signed)
Please inform the patient that her cholesterol is slightly elevated. I recommend decreasing her intake of carbs and fatty foods. She is in the prediabetes range, and I recommend reducing her intake of high-sugar foods and increasing her physical activities by walking daily.

## 2022-07-18 ENCOUNTER — Other Ambulatory Visit: Payer: Self-pay

## 2022-07-18 ENCOUNTER — Telehealth: Payer: Self-pay

## 2022-07-18 DIAGNOSIS — R7303 Prediabetes: Secondary | ICD-10-CM

## 2022-07-18 DIAGNOSIS — E785 Hyperlipidemia, unspecified: Secondary | ICD-10-CM

## 2022-07-18 NOTE — Telephone Encounter (Signed)
Referral placed to [facility] [place].

## 2022-07-18 NOTE — Progress Notes (Signed)
amb  

## 2022-07-18 NOTE — Telephone Encounter (Signed)
You place a referral in

## 2022-07-29 ENCOUNTER — Ambulatory Visit (INDEPENDENT_AMBULATORY_CARE_PROVIDER_SITE_OTHER): Payer: PPO | Admitting: Nurse Practitioner

## 2022-07-29 DIAGNOSIS — Z0000 Encounter for general adult medical examination without abnormal findings: Secondary | ICD-10-CM

## 2022-07-29 NOTE — Progress Notes (Deleted)
I connected with  [name] on 07/29/22 by a {Video Enabled:26378::"video and audio"} enabled telemedicine application and verified that I am speaking with the correct person using two identifiers.  {Patient [phone]::"Home"}  {Provider [phone]::"Home Office"}  I discussed the limitations of evaluation and management by telemedicine. The patient expressed understanding and agreed to proceed.  Subjective:   Misty Wilson is a 81 y.o. female who presents for Medicare Annual (Subsequent) preventive examination.  Review of Systems    ***       Objective:    There were no vitals filed for this visit. There is no height or weight on file to calculate BMI.     12/11/2020    8:53 AM 11/23/2020   10:50 AM 09/02/2020   11:51 AM 09/03/2018   11:25 AM 12/09/2015    7:15 AM 12/04/2015   12:56 PM 07/31/2015    1:26 PM  Advanced Directives  Does Patient Have a Medical Advance Directive? No No No No No No No  Would patient like information on creating a medical advance directive? No - Patient declined  No - Patient declined  No - patient declined information Yes - Educational materials given     Current Medications (verified) Outpatient Encounter Medications as of 07/29/2022  Medication Sig   Ascorbic Acid (VITAMIN C) 1000 MG tablet Take 2,000 mg by mouth daily.   aspirin EC 81 MG tablet Take 81 mg by mouth every evening. Swallow whole.   Calcium-Magnesium-Vitamin D (CALCIUM 1200+D3 PO) Take 1 tablet by mouth daily.   cholecalciferol (VITAMIN D) 25 MCG (1000 UNIT) tablet Take 1,000 Units by mouth daily.   Ginkgo Biloba 120 MG CAPS Take 120 mg by mouth daily.   hydrochlorothiazide (MICROZIDE) 12.5 MG capsule TAKE ONE CAPSULE BY MOUTH DAILY   ibuprofen (ADVIL,MOTRIN) 200 MG tablet Take 400 mg by mouth every 8 (eight) hours as needed for moderate pain (knee pain.).    Krill Oil 350 MG CAPS Take 350 mg by mouth daily.   Turmeric 500 MG CAPS Take 5,000 mg by mouth  daily.   vitamin B-12 (CYANOCOBALAMIN) 1000 MCG tablet Take 1,000 mcg by mouth daily.   No facility-administered encounter medications on file as of 07/29/2022.    Allergies (verified) Codeine, Other, and Prednisone   History: Past Medical History:  Diagnosis Date   Breast mass, left 04/25/2013   Multiple gastric ulcers    Past Surgical History:  Procedure Laterality Date   AUGMENTATION MAMMAPLASTY     CATARACT EXTRACTION W/PHACO Right 11/23/2020   Procedure: CATARACT EXTRACTION PHACO AND INTRAOCULAR LENS PLACEMENT RIGHT EYE;  Surgeon: [name], MD;  Location: [facility];  Service: Ophthalmology;  Laterality: Right;  right CDE=28.15   CATARACT EXTRACTION W/PHACO Left 12/14/2020   Procedure: CATARACT EXTRACTION PHACO AND INTRAOCULAR LENS PLACEMENT (IOC);  Surgeon: [name], MD;  Location: [facility];  Service: Ophthalmology;  Laterality: Left;  CDE: 20.82   CHOLECYSTECTOMY     COLONOSCOPY N/A 04/06/2015   Procedure: COLONOSCOPY;  Surgeon: Daneil Dolin, MD;  [facility];  Service: Endoscopy;  Laterality: N/A;  8:45   COLONOSCOPY N/A 09/02/2020   Procedure: COLONOSCOPY;  Surgeon: Daneil Dolin, MD;  [facility];  Service: Endoscopy;  Laterality: N/A;  1:00pm   EXCISION MORTON'S NEUROMA Left 12/09/2015   Procedure: EXCISION NEUROMA LEFT FOOT;  Surgeon: Caprice Beaver, DPM;  Location: [facility];  Service: Podiatry;  Laterality: Left;   POLYPECTOMY  09/02/2020   Procedure: POLYPECTOMY;  Surgeon: Daneil Dolin, MD;  [facility];  Service: Endoscopy;;   TUBAL LIGATION     veins     had varicose veins done   Family History  Problem Relation Age of Onset   Cancer Mother        colon   Other Mother        aneursym   Cancer Brother        colon   Cancer Sister        bone,breast   Social History   Socioeconomic History   Marital status: Divorced    Spouse name: Not on file   Number of children: Not on file   Years of education: Not on file    Highest education level: Not on file  Occupational History   Not on file  Tobacco Use   Smoking status: Light Smoker    Packs/day: 0.10    Years: 12.00    Total pack years: 1.20    Types: Cigarettes   Smokeless tobacco: Never   Tobacco comments:    smokes 1 pack per week  Vaping Use   Vaping Use: Never used  Substance and Sexual Activity   Alcohol use: No   Drug use: No   Sexual activity: Not Currently    Birth control/protection: Post-menopausal, Surgical    Comment: tubal  Other Topics Concern   Not on file  Social History Narrative   Not on file   Social Determinants of Health   Financial Resource Strain: Low Risk  (04/07/2020)   Overall Financial Resource Strain (CARDIA)    Difficulty of Paying Living Expenses: Not hard at all  Food Insecurity: No Food Insecurity (04/07/2020)   Hunger Vital Sign    Worried About Running Out of Food in the Last Year: Never true    Ran Out of Food in the Last Year: Never true  Transportation Needs: No Transportation Needs (04/07/2020)   PRAPARE - Hydrologist (Medical): No    Lack of Transportation (Non-Medical): No  Physical Activity: Insufficiently Active (04/07/2020)   Exercise Vital Sign    Days of Exercise per Week: 4 days    Minutes of Exercise per Session: 20 min  Stress: No Stress Concern Present (04/07/2020)   [place]    Feeling of Stress : Not at all  Social Connections: Moderately Isolated (04/07/2020)   Social Connection and Isolation Panel [NHANES]    Frequency of Communication with Friends and Family: More than three times a week    Frequency of Social Gatherings with Friends and Family: Twice a week    Attends Religious Services: 1 to 4 times per year    Active Member of [employer] or Organizations: No    Attends Archivist Meetings: Never    Marital Status: Divorced    Tobacco Counseling Ready to quit: Not  Answered Counseling given: Not Answered Tobacco comments: smokes 1 pack per week   Clinical Intake:                 Diabetic?***         Activities of Daily Living     No data to display          Patient Care Team: Alvira Monday, [place] as PCP - General (Family Medicine) Gala Romney, Cristopher Estimable, MD as Consulting Physician (Gastroenterology)  Indicate any recent Medical Services you may have received from other than [facility] providers in  the past year (date may be approximate).     Assessment:   This is a routine wellness examination for [place].  Hearing/Vision screen No results found.  Dietary issues and exercise activities discussed:     Goals Addressed   None    Depression Screen    07/12/2022    8:15 AM 04/07/2020    8:39 AM 05/01/2018    8:52 AM  PHQ 2/9 Scores  PHQ - 2 Score 0 0 0  PHQ- 9 Score  0     Fall Risk    07/12/2022    8:15 AM 04/07/2020    8:39 AM  Fall Risk   Falls in the past year? 0 0  Number falls in past yr: 0   Injury with Fall? 0   Risk for fall due to : No Fall Risks   Follow up Falls evaluation completed     [place]:  Any stairs in or around the home? {YES/NO:21197} If so, are there any without handrails? {YES/NO:21197} Home free of loose throw rugs in walkways, pet beds, electrical cords, etc? {YES/NO:21197} Adequate lighting in your home to reduce risk of falls? {YES/NO:21197}  ASSISTIVE DEVICES UTILIZED TO PREVENT FALLS:  Life alert? {YES/NO:21197} Use of a cane, walker or w/c? {YES/NO:21197} Grab bars in the bathroom? {YES/NO:21197} Shower chair or bench in shower? {YES/NO:21197} Elevated toilet seat or a handicapped toilet? {YES/NO:21197}  TIMED UP AND GO:  Was the test performed? {YES/NO:21197}.  Length of time to ambulate 10 feet: *** sec.   {Appearance of ZOXW:9604540}  Cognitive Function:        Immunizations  There is no immunization history on file for this  patient.  {TDAP status:2101805}  {Flu Vaccine status:2101806}  {Pneumococcal vaccine status:2101807}  {Covid-19 vaccine status:2101808}  Qualifies for Shingles Vaccine? {YES/NO:21197}  Zostavax completed {YES/NO:21197}  {Shingrix Completed?:2101804}  Screening Tests Health Maintenance  Topic Date Due   COVID-19 Vaccine (1) Never done   DEXA SCAN  Completed   HPV VACCINES  Aged Out   Pneumonia Vaccine 19+ Years old  Discontinued   INFLUENZA VACCINE  Discontinued   TETANUS/TDAP  Discontinued   Zoster Vaccines- Shingrix  Discontinued    Health Maintenance  Health Maintenance Due  Topic Date Due   COVID-19 Vaccine (1) Never done    {Colorectal cancer screening:2101809}  {Mammogram status:21018020}  {Bone Density status:21018021}  Lung Cancer Screening: (Low Dose CT Chest recommended if Age 39-80 years, 30 pack-year currently smoking OR have quit w/in 15years.) {DOES NOT does:27190::"does not"} qualify.   Lung Cancer Screening Referral: ***  Additional Screening:  Hepatitis C Screening: {DOES NOT does:27190::"does not"} qualify; Completed ***  Vision Screening: Recommended annual ophthalmology exams for early detection of glaucoma and other disorders of the eye. Is the patient up to date with their annual eye exam?  {YES/NO:21197} Who is the provider or what is the name of the office in which the patient attends annual eye exams? *** If pt is not established with a provider, would they like to be referred to a provider to establish care? {YES/NO:21197}.   Dental Screening: Recommended annual dental exams for proper oral hygiene  Community Resource Referral / Chronic Care Management: CRR required this visit?  {YES/NO:21197}  CCM required this visit?  {YES/NO:21197}     Plan:     I have personally reviewed and noted the following in the patient's chart:   Medical and social history Use of alcohol, tobacco or illicit drugs  Current medications and  supplements including opioid prescriptions. {Opioid [phone]} Functional ability and status Nutritional status Physical activity Advanced directives List of other physicians Hospitalizations, surgeries, and ER visits in previous 12 months Vitals Screenings to include cognitive, depression, and falls Referrals and appointments  In addition, I have reviewed and discussed with patient certain preventive protocols, quality metrics, and best practice recommendations. A written personalized care plan for preventive services as well as general preventive health recommendations were provided to patient.     Renee Rival, FNP   07/29/2022   Nurse Notes: ***

## 2022-07-29 NOTE — Progress Notes (Signed)
Patient is at work, not able to do her AWV today , she will reschedule

## 2022-07-29 NOTE — Patient Instructions (Signed)
  Ms. Gage , Thank you for taking time to come for your Medicare Wellness Visit. I appreciate your ongoing commitment to your health goals. Please review the following plan we discussed and let me know if I can assist you in the future.   These are the goals we discussed:  Goals   None     This is a list of the screening recommended for you and due dates:  Health Maintenance  Topic Date Due   COVID-19 Vaccine (1) Never done   DEXA scan (bone density measurement)  Completed   HPV Vaccine  Aged Out   Pneumonia Vaccine  Discontinued   Flu Shot  Discontinued   Tetanus Vaccine  Discontinued   Zoster (Shingles) Vaccine  Discontinued

## 2022-08-02 ENCOUNTER — Other Ambulatory Visit (HOSPITAL_COMMUNITY)
Admission: RE | Admit: 2022-08-02 | Discharge: 2022-08-02 | Disposition: A | Discharge status: Home / Self Care | Payer: PPO | Source: Ambulatory Visit | Attending: Adult Health | Admitting: Adult Health

## 2022-08-02 ENCOUNTER — Ambulatory Visit (INDEPENDENT_AMBULATORY_CARE_PROVIDER_SITE_OTHER): Payer: PPO | Admitting: Adult Health

## 2022-08-02 ENCOUNTER — Encounter: Payer: Self-pay | Admitting: Adult Health

## 2022-08-02 VITALS — BP 141/87 | HR 59 | Ht 65.0 in | Wt 169.5 lb

## 2022-08-02 DIAGNOSIS — Z1211 Encounter for screening for malignant neoplasm of colon: Secondary | ICD-10-CM | POA: Diagnosis not present

## 2022-08-02 DIAGNOSIS — Z124 Encounter for screening for malignant neoplasm of cervix: Secondary | ICD-10-CM | POA: Diagnosis present

## 2022-08-02 DIAGNOSIS — N814 Uterovaginal prolapse, unspecified: Secondary | ICD-10-CM | POA: Diagnosis not present

## 2022-08-02 DIAGNOSIS — Z01419 Encounter for gynecological examination (general) (routine) without abnormal findings: Secondary | ICD-10-CM

## 2022-08-02 DIAGNOSIS — I10 Essential (primary) hypertension: Secondary | ICD-10-CM

## 2022-08-02 DIAGNOSIS — Z1231 Encounter for screening mammogram for malignant neoplasm of breast: Secondary | ICD-10-CM | POA: Insufficient documentation

## 2022-08-02 DIAGNOSIS — Z4689 Encounter for fitting and adjustment of other specified devices: Secondary | ICD-10-CM | POA: Diagnosis not present

## 2022-08-02 DIAGNOSIS — R87610 Atypical squamous cells of undetermined significance on cytologic smear of cervix (ASC-US): Secondary | ICD-10-CM

## 2022-08-02 LAB — HEMOCCULT GUIAC POC 1CARD (OFFICE): Fecal Occult Blood, POC: NEGATIVE

## 2022-08-02 NOTE — Progress Notes (Signed)
Patient ID: Misty Wilson, female   DOB: 1941/10/14, 81 y.o.   MRN: [id number] History of Present Illness: Misty Wilson is a 81 year old white female,divorced, PM in for a well woman gyn exam and pap. She had ASCUS negative HPV 04/07/20. She works 6 days a week and goes to [employer]. She is for pessary maintenance too.  PCP is Misty Grain, NP   Current Medications, Allergies, Past Medical History, Past Surgical History, Family History and Social History were reviewed in [employer] record.     Review of Systems: Patient denies any headaches, hearing loss, fatigue, blurred vision, shortness of breath, chest pain, abdominal pain, problems with bowel movements, urination, or intercourse(not having sex). No joint pain or mood swings.  She denies any bleeding or vaginal discharge.    Physical Exam:BP (!) 141/87 (BP Location: Left Arm, Patient Position: Sitting, Cuff Size: Normal)   Pulse (!) 59   Ht '5\' 5"'$  (1.651 m)   Wt 169 lb 8 oz (76.9 kg)   BMI 28.21 kg/m   General:  Well developed, well nourished, no acute distress Skin:  Warm and dry Neck:  Midline trachea, normal thyroid, good ROM, no lymphadenopathy,no carotid bruits heard Lungs; Clear to auscultation bilaterally Breast:  No dominant palpable mass, retraction, or nipple discharge,has bilateral implants Cardiovascular: Regular rate and rhythm Abdomen:  Soft, non tender, no hepatosplenomegaly Pelvic:  External genitalia is normal in appearance, no lesions.Has clit ring.  The vagina:gelhorn pessary easily removed and cleaned with soap and water,slight odor, no lesions, +cystocele.  Urethra has no lesions or masses. The cervix is smooth, pap with HR HPV genotyping performed, pessary reinserted.  Uterus is felt to be normal size, shape, and contour.  No adnexal masses or tenderness noted.Bladder is non tender, no masses felt. Rectal: Good sphincter tone, no polyps, or hemorrhoids felt.  Hemoccult  negative. Extremities/musculoskeletal:  No swelling or varicosities noted, no clubbing or cyanosis Psych:  No mood changes, alert and cooperative,seems happy AA is 0 Fall risk is low    08/02/2022    9:22 AM 07/12/2022    8:15 AM 04/07/2020    8:39 AM  Depression screen PHQ 2/9  Decreased Interest 0 0 0  Down, Depressed, Hopeless 0 0 0  PHQ - 2 Score 0 0 0  Altered sleeping 0  0  Tired, decreased energy 0  0  Change in appetite 0  0  Feeling bad or failure about yourself  0  0  Trouble concentrating 0  0  Moving slowly or fidgety/restless 0  0  Suicidal thoughts 0  0  PHQ-9 Score 0  0       08/02/2022    9:22 AM 04/07/2020    8:40 AM  GAD 7 : Generalized Anxiety Score  Nervous, Anxious, on Edge 0 0  Control/stop worrying 0 0  Worry too much - different things 0 0  Trouble relaxing 0 0  Restless 0 0  Easily annoyed or irritable 0 0  Afraid - awful might happen 0 0  Total GAD 7 Score 0 0  Anxiety Difficulty  Not difficult at all    Upstream - 08/02/22 0998       Pregnancy Intention Screening   Does the patient want to become pregnant in the next year? N/A    Does the patient's partner want to become pregnant in the next year? N/A    Would the patient like to discuss contraceptive options today? N/A  Contraception Wrap Up   Current Method Female Sterilization    End Method Female Sterilization    Contraception Counseling Provided No              Examination chaperoned by Misty Pupa LPN  Impression 1. Routine Papanicolaou smear Pap sent  - Cytology - P[facility]( [facility])  2. Pessary maintenance Follow up in 4 months for for maintenance  3. Cystocele with uterine prolapse  4. Encounter for screening fecal occult blood testing Hemoccult was negative   5. Encounter for gynecological examination with Papanicolaou smear of cervix Pap sent Labs with PCP Can get physical with me or PCP  6. ASCUS of cervix with negative high risk HPV Pap sent   7.  Hypertension, unspecified type She has Microzide but doe not take every day   8. Screening mammogram for breast cancer Mammogram scheduled for 10/11/ 23 at 8:45 am at [place]; Future

## 2022-08-04 LAB — CYTOLOGY - PAP
Comment: NEGATIVE
Diagnosis: NEGATIVE
High risk HPV: NEGATIVE

## 2022-08-05 ENCOUNTER — Other Ambulatory Visit: Payer: Self-pay | Admitting: Adult Health

## 2022-08-05 MED ORDER — FLUCONAZOLE 150 MG PO TABS
ORAL_TABLET | ORAL | 1 refills | Status: DC
Start: 2022-08-05 — End: 2022-09-13

## 2022-08-10 ENCOUNTER — Ambulatory Visit (HOSPITAL_COMMUNITY): Payer: PPO

## 2022-09-07 ENCOUNTER — Ambulatory Visit (HOSPITAL_COMMUNITY): Payer: PPO

## 2022-09-08 ENCOUNTER — Ambulatory Visit: Payer: PPO | Admitting: Nutrition

## 2022-09-13 ENCOUNTER — Encounter: Payer: Self-pay | Admitting: Internal Medicine

## 2022-09-13 ENCOUNTER — Ambulatory Visit (INDEPENDENT_AMBULATORY_CARE_PROVIDER_SITE_OTHER): Payer: PPO | Admitting: Internal Medicine

## 2022-09-13 DIAGNOSIS — Z0000 Encounter for general adult medical examination without abnormal findings: Secondary | ICD-10-CM

## 2022-09-13 NOTE — Patient Instructions (Signed)
  Ms. Strider , Thank you for taking time to come for your Medicare Wellness Visit. I appreciate your ongoing commitment to your health goals. Please review the following plan we discussed and let me know if I can assist you in the future.   These are the goals we discussed: Staying active and eating healthy.   This is a list of the screening recommended for you and due dates:  Health Maintenance  Topic Date Due   Medicare Annual Wellness Visit  Never done   COVID-19 Vaccine (1) Never done   DEXA scan (bone density measurement)  Completed   HPV Vaccine  Aged Out   Pneumonia Vaccine  Discontinued   Flu Shot  Discontinued   Tetanus Vaccine  Discontinued   Zoster (Shingles) Vaccine  Discontinued

## 2022-09-13 NOTE — Progress Notes (Signed)
Subjective:  This is a telephone encounter between Misty Wilson and Misty Wilson on 09/13/2022 for AWV. The visit was conducted with the patient located at home and Misty Wilson at [facility]. The patient's identity was confirmed using their DOB and current address. The patient has consented to being evaluated through a telephone encounter and understands the associated risks (an examination cannot be done and the patient may need to come in for an appointment) / benefits (allows the patient to remain at home, decreasing exposure to coronavirus).      Misty Wilson is a 81 y.o. female who presents for an Initial Medicare Annual Wellness Visit.  Review of Systems    Review of Systems  All other systems reviewed and are negative.     Objective:    There were no vitals filed for this visit. There is no height or weight on file to calculate BMI.     09/13/2022    8:47 AM 12/11/2020    8:53 AM 11/23/2020   10:50 AM 09/02/2020   11:51 AM 09/03/2018   11:25 AM 12/09/2015    7:15 AM 12/04/2015   12:56 PM  Advanced Directives  Does Patient Have a Medical Advance Directive? Yes No No No No No No  Would patient like information on creating a medical advance directive?  No - Patient declined  No - Patient declined  No - patient declined information Yes - Educational materials given    Current Medications (verified) Outpatient Encounter Medications as of 09/13/2022  Medication Sig   Ascorbic Acid (VITAMIN C) 1000 MG tablet Take 2,000 mg by mouth daily.   aspirin EC 81 MG tablet Take 81 mg by mouth every evening. Swallow whole.   Calcium-Magnesium-Vitamin D (CALCIUM 1200+D3 PO) Take 1 tablet by mouth daily.   cholecalciferol (VITAMIN D) 25 MCG (1000 UNIT) tablet Take 1,000 Units by mouth daily.   Ginkgo Biloba 120 MG CAPS Take 120 mg by mouth daily.   hydrochlorothiazide (MICROZIDE) 12.5 MG capsule TAKE ONE CAPSULE BY MOUTH DAILY   Krill Oil 350 MG CAPS Take 350 mg by mouth daily.    Turmeric 500 MG CAPS Take 5,000 mg by mouth daily.   vitamin B-12 (CYANOCOBALAMIN) 1000 MCG tablet Take 1,000 mcg by mouth daily.   [DISCONTINUED] fluconazole (DIFLUCAN) 150 MG tablet Take 1 now and can repeat 1 in 3 days if needed   [DISCONTINUED] ibuprofen (ADVIL,MOTRIN) 200 MG tablet Take 400 mg by mouth every 8 (eight) hours as needed for moderate pain (knee pain.).    No facility-administered encounter medications on file as of 09/13/2022.    Allergies (verified) Codeine, Other, and Prednisone   History: Past Medical History:  Diagnosis Date   Breast mass, left 04/25/2013   Multiple gastric ulcers    Past Surgical History:  Procedure Laterality Date   AUGMENTATION MAMMAPLASTY     CATARACT EXTRACTION W/PHACO Right 11/23/2020   Procedure: CATARACT EXTRACTION PHACO AND INTRAOCULAR LENS PLACEMENT RIGHT EYE;  Surgeon: [name], MD;  Location: [facility];  Service: Ophthalmology;  Laterality: Right;  right CDE=28.15   CATARACT EXTRACTION W/PHACO Left 12/14/2020   Procedure: CATARACT EXTRACTION PHACO AND INTRAOCULAR LENS PLACEMENT (IOC);  Surgeon: [name], MD;  Location: [facility];  Service: Ophthalmology;  Laterality: Left;  CDE: 20.82   CHOLECYSTECTOMY     COLONOSCOPY N/A 04/06/2015   Procedure: COLONOSCOPY;  Surgeon: Daneil Dolin, MD;  Location[facility];  Service: Endoscopy;  Laterality: N/A;  8:45   COLONOSCOPY  N/A 09/02/2020   Procedure: COLONOSCOPY;  Surgeon: Daneil Dolin, MD;  [facility];  Service: Endoscopy;  Laterality: N/A;  1:00pm   EXCISION MORTON'S NEUROMA Left 12/09/2015   Procedure: EXCISION NEUROMA LEFT FOOT;  Surgeon: Caprice Beaver, DPM;  Location: [facility];  Service: Podiatry;  Laterality: Left;   POLYPECTOMY  09/02/2020   Procedure: POLYPECTOMY;  Surgeon: Daneil Dolin, MD;  [facility];  Service: Endoscopy;;   TUBAL LIGATION     veins     had varicose veins done   Family History  Problem Relation Age of Onset   Cancer  Mother        colon   Other Mother        aneursym   Cancer Brother        colon   Cancer Sister        bone,breast   Suicidality Daughter    Social History   Socioeconomic History   Marital status: Divorced    Spouse name: Not on file   Number of children: Not on file   Years of education: Not on file   Highest education level: Not on file  Occupational History   Not on file  Tobacco Use   Smoking status: Light Smoker    Packs/day: 0.10    Years: 12.00    Total pack years: 1.20    Types: Cigarettes   Smokeless tobacco: Never   Tobacco comments:    smokes 1 pack per week  Vaping Use   Vaping Use: Never used  Substance and Sexual Activity   Alcohol use: No   Drug use: No   Sexual activity: Not Currently    Birth control/protection: Post-menopausal, Surgical    Comment: tubal  Other Topics Concern   Not on file  Social History Narrative   Not on file   Social Determinants of Health   Financial Resource Strain: Low Risk  (08/02/2022)   Overall Financial Resource Strain (CARDIA)    Difficulty of Paying Living Expenses: Not hard at all  Food Insecurity: No Food Insecurity (08/02/2022)   Hunger Vital Sign    Worried About Running Out of Food in the Last Year: Never true    Ran Out of Food in the Last Year: Never true  Transportation Needs: No Transportation Needs (08/02/2022)   PRAPARE - Hydrologist (Medical): No    Lack of Transportation (Non-Medical): No  Physical Activity: Sufficiently Active (08/02/2022)   Exercise Vital Sign    Days of Exercise per Week: 3 days    Minutes of Exercise per Session: 50 min  Stress: No Stress Concern Present (08/02/2022)   [place]    Feeling of Stress : Not at all  Social Connections: Moderately Isolated (08/02/2022)   Social Connection and Isolation Panel [NHANES]    Frequency of Communication with Friends and Family: More than three  times a week    Frequency of Social Gatherings with Friends and Family: Twice a week    Attends Religious Services: 1 to 4 times per year    Active Member of [employer] or Organizations: No    Attends Music therapist: Never    Marital Status: Divorced    Tobacco Counseling Ready to quit: Not Answered Counseling given: Not Answered Tobacco comments: smokes 1 pack per week   Clinical Intake:  Pre-visit preparation completed: Yes  Pain : No/denies pain  Diabetes: No  How often do you need to have someone help you when you read instructions, pamphlets, or other written materials from your doctor or pharmacy?: 1 - Never What is the last grade level you completed in school?: 10th grade   Activities of Daily Living    09/13/2022    8:49 AM  In your present state of health, do you have any difficulty performing the following activities:  Hearing? 0  Vision? 0  Difficulty concentrating or making decisions? 0  Walking or climbing stairs? 0  Dressing or bathing? 0  Doing errands, shopping? 0    Patient Care Team: Alvira Monday, FNP as PCP - General (Family Medicine) Gala Romney Cristopher Estimable, MD as Consulting Physician (Gastroenterology)  Indicate any recent Medical Services you may have received from other than [facility] providers in the past year (date may be approximate).     Assessment:   This is a routine wellness examination for Misty.  Hearing/Vision screen No results found.  Dietary issues and exercise activities discussed:     Goals Addressed   None    Depression Screen    09/13/2022    8:49 AM 08/02/2022    9:22 AM 07/12/2022    8:15 AM 04/07/2020    8:39 AM 05/01/2018    8:52 AM  PHQ 2/9 Scores  PHQ - 2 Score 0 0 0 0 0  PHQ- 9 Score  0  0     Fall Risk    09/13/2022    8:49 AM 08/02/2022    9:21 AM 07/12/2022    8:15 AM 04/07/2020    8:39 AM  Fall Risk   Falls in the past year? 0 0 0 0  Number falls in past yr: 0  0   Injury with Fall? 0  0    Risk for fall due to :   No Fall Risks   Follow up   Falls evaluation completed     FALL RISK PREVENTION PERTAINING TO THE HOME:  Any stairs in or around the home? Yes  If so, are there any without handrails? Yes  Home free of loose throw rugs in walkways, pet beds, electrical cords, etc? Yes  Adequate lighting in your home to reduce risk of falls? Yes   ASSISTIVE DEVICES UTILIZED TO PREVENT FALLS:  Life alert? No  Use of a cane, walker or w/c? No  Grab bars in the bathroom? No  Shower chair or bench in shower? No  Elevated toilet seat or a handicapped toilet? No    Cognitive Function:        09/13/2022    8:49 AM  6CIT Screen  What Year? 0 points  What month? 0 points  What time? 0 points  Count back from 20 0 points  Months in reverse 0 points  Repeat phrase 0 points  Total Score 0 points    Immunizations  There is no immunization history on file for this patient.  TDAP status: Due, Education has been provided regarding the importance of this vaccine. Advised may receive this vaccine at local pharmacy or Health Dept. Aware to provide a copy of the vaccination record if obtained from local pharmacy or Health Dept. Verbalized acceptance and understanding.  Flu Vaccine status: Declined, Education has been provided regarding the importance of this vaccine but patient still declined. Advised may receive this vaccine at local pharmacy or Health Dept. Aware to provide a copy of the vaccination record if obtained from local pharmacy or Health Dept.  Verbalized acceptance and understanding.  Pneumococcal vaccine status: Declined,  Education has been provided regarding the importance of this vaccine but patient still declined. Advised may receive this vaccine at local pharmacy or Health Dept. Aware to provide a copy of the vaccination record if obtained from local pharmacy or Health Dept. Verbalized acceptance and understanding.   Covid-19 vaccine status: Information provided  on how to obtain vaccines.   Qualifies for Shingles Vaccine? Yes   Zostavax completed No   Shingrix Completed?: Yes  Screening Tests Health Maintenance  Topic Date Due   Medicare Annual Wellness (AWV)  Never done   COVID-19 Vaccine (1) Never done   DEXA SCAN  Completed   HPV VACCINES  Aged Out   Pneumonia Vaccine 61+ Years old  Discontinued   INFLUENZA VACCINE  Discontinued   TETANUS/TDAP  Discontinued   Zoster Vaccines- Shingrix  Discontinued    Health Maintenance  Health Maintenance Due  Topic Date Due   Medicare Annual Wellness (AWV)  Never done   COVID-19 Vaccine (1) Never done    Colorectal cancer screening: No longer required.   Mammogram status: Schedule for next week.   Bone Density status: Completed 05/10/2018. Results reflect: Bone density results: NORMAL.   Lung Cancer Screening: (Low Dose CT Chest recommended if Age 44-80 years, 30 pack-year currently smoking OR have quit w/in 15years.) does not qualify.    Additional Screening:  Hepatitis C Screening: does not qualify  Vision Screening: Recommended annual ophthalmology exams for early detection of glaucoma and other disorders of the eye. Is the patient up to date with their annual eye exam?  Yes  Who is the provider or what is the name of the office in which the patient attends annual eye exams? [facility] in [place] If pt is not established with a provider, would they like to be referred to a provider to establish care? No .   Dental Screening: Recommended annual dental exams for proper oral hygiene  Community Resource Referral / Chronic Care Management: CRR required this visit?  No   CCM required this visit?  No      Plan:     I have personally reviewed and noted the following in the patient's chart:   Medical and social history Use of alcohol, tobacco or illicit drugs  Current medications and supplements including opioid prescriptions. Patient is not currently taking opioid  prescriptions. Functional ability and status Nutritional status Physical activity Advanced directives List of other physicians Hospitalizations, surgeries, and ER visits in previous 12 months Vitals Screenings to include cognitive, depression, and falls Referrals and appointments  In addition, I have reviewed and discussed with patient certain preventive protocols, quality metrics, and best practice recommendations. A written personalized care plan for preventive services as well as general preventive health recommendations were provided to patient.     Misty Dy, MD   09/13/2022

## 2022-09-21 ENCOUNTER — Ambulatory Visit (HOSPITAL_COMMUNITY): Payer: PPO

## 2022-10-12 ENCOUNTER — Ambulatory Visit: Payer: PPO | Admitting: Family Medicine

## 2022-12-02 ENCOUNTER — Ambulatory Visit: Payer: PPO | Admitting: Adult Health

## 2022-12-29 ENCOUNTER — Encounter: Payer: Self-pay | Admitting: Radiology

## 2023-03-04 ENCOUNTER — Ambulatory Visit
Admission: EM | Admit: 2023-03-04 | Discharge: 2023-03-04 | Disposition: A | Discharge status: Home / Self Care | Payer: PPO | Attending: Family Medicine | Admitting: Family Medicine

## 2023-03-04 DIAGNOSIS — M25562 Pain in left knee: Secondary | ICD-10-CM | POA: Diagnosis not present

## 2023-03-04 MED ORDER — DEXAMETHASONE SODIUM PHOSPHATE 10 MG/ML IJ SOLN
10.0000 mg | Freq: Once | INTRAMUSCULAR | Status: AC
  Administered 2023-03-04: 10 mg via INTRAMUSCULAR

## 2023-03-04 NOTE — ED Triage Notes (Signed)
Pt reports with left knee pain that started a week ago, states she think she hurt it while outside planting flowers. Taking ibuprofen with no relief of pain.

## 2023-03-04 NOTE — Discharge Instructions (Signed)
Meds ordered this encounter  Medications   dexamethasone (DECADRON) injection 10 mg   Activities as tolerated.

## 2023-03-04 NOTE — ED Provider Notes (Signed)
[facility]   [id number] 03/04/23 Arrival Time: 1135  ASSESSMENT & PLAN:  1. Acute pain of left knee    Suspect strain vs developing patellar tendonitis. Discussed. IM Decadron given. Cont OTC ibuprofen. Activities as tolerated.  Meds ordered this encounter  Medications   dexamethasone (DECADRON) injection 10 mg   Recommend:  Follow-up Information     Gilmore Laroche, FNP.   Specialty: Family Medicine Why: If worsening or failing to improve as anticipated. Contact information: [address] [place] [address] [phone]                Reviewed expectations re: course of current medical issues. Questions answered. Outlined signs and symptoms indicating need for more acute intervention. Patient verbalized understanding. After Visit Summary given.  SUBJECTIVE: History from: patient. Misty Wilson is a 82 y.o. female who reports left knee pain that started a week ago. Questions whether she hurt it while outside planting flowers. Kneeling much of the afternoon before pain started. Taking ibuprofen without much help. No extremity sensation changes or weakness.   Past Surgical History:  Procedure Laterality Date   AUGMENTATION MAMMAPLASTY     CATARACT EXTRACTION W/PHACO Right 11/23/2020   Procedure: CATARACT EXTRACTION PHACO AND INTRAOCULAR LENS PLACEMENT RIGHT EYE;  Surgeon: Fabio Pierce, MD;  Location: [facility];  Service: Ophthalmology;  Laterality: Right;  right CDE=28.15   CATARACT EXTRACTION W/PHACO Left 12/14/2020   Procedure: CATARACT EXTRACTION PHACO AND INTRAOCULAR LENS PLACEMENT (IOC);  Surgeon: Fabio Pierce, MD;  Location: [facility];  Service: Ophthalmology;  Laterality: Left;  CDE: 20.82   CHOLECYSTECTOMY     COLONOSCOPY N/A 04/06/2015   Procedure: COLONOSCOPY;  Surgeon: Corbin Ade, MD;  [facility];  Service: Endoscopy;  Laterality: N/A;  8:45   COLONOSCOPY N/A 09/02/2020   Procedure: COLONOSCOPY;  Surgeon: Corbin Ade, MD;  [facility];  Service: Endoscopy;  Laterality: N/A;  1:00pm   EXCISION MORTON'S NEUROMA Left 12/09/2015   Procedure: EXCISION NEUROMA LEFT FOOT;  Surgeon: Ferman Hamming, DPM;  Location: [facility];  Service: Podiatry;  Laterality: Left;   POLYPECTOMY  09/02/2020   Procedure: POLYPECTOMY;  Surgeon: Corbin Ade, MD;  [facility];  Service: Endoscopy;;   TUBAL LIGATION     veins     had varicose veins done      OBJECTIVE:  Vitals:   03/04/23 1302  BP: 128/81  Pulse: 67  Resp: 17  Temp: 97.9 F (36.6 C)  TempSrc: Oral  SpO2: 96%    General appearance: alert; no distress HEENT: [place]; AT Neck: supple with FROM Resp: unlabored respirations Extremities: LLEE: warm with well perfused appearance; poorly localized moderate tenderness over left anterior/inferior knee; without gross deformities; swelling: none; bruising: none; knee ROM: normal CV: brisk extremity capillary refill of LLE; 2+ DP pulse of LLE. Skin: warm and dry; no visible rashes Neurologic: gait normal; normal sensation and strength of LLE Psychological: alert and cooperative; normal mood and affect  Allergies  Allergen Reactions   Codeine Nausea And Vomiting   Other Nausea And Vomiting    Can not take Mycin drugs cause terrible stomach spasms   Prednisone Hypertension    Past Medical History:  Diagnosis Date   Breast mass, left 04/25/2013   Multiple gastric ulcers    Social History   Socioeconomic History   Marital status: Divorced    Spouse name: Not on file   Number of children: Not on file  Years of education: Not on file   Highest education level: Not on file  Occupational History   Not on file  Tobacco Use   Smoking status: Light Smoker    Packs/day: 0.10    Years: 12.00    Additional pack years: 0.00    Total pack years: 1.20    Types: Cigarettes   Smokeless tobacco: Never   Tobacco comments:    smokes 1 pack per week  Vaping Use   Vaping Use: Never used   Substance and Sexual Activity   Alcohol use: No   Drug use: No   Sexual activity: Not Currently    Birth control/protection: Post-menopausal, Surgical    Comment: tubal  Other Topics Concern   Not on file  Social History Narrative   Not on file   Social Determinants of Health   Financial Resource Strain: Low Risk  (08/02/2022)   Overall Financial Resource Strain (CARDIA)    Difficulty of Paying Living Expenses: Not hard at all  Food Insecurity: No Food Insecurity (08/02/2022)   Hunger Vital Sign    Worried About Running Out of Food in the Last Year: Never true    Ran Out of Food in the Last Year: Never true  Transportation Needs: No Transportation Needs (08/02/2022)   PRAPARE - Administrator, Civil Service (Medical): No    Lack of Transportation (Non-Medical): No  Physical Activity: Sufficiently Active (08/02/2022)   Exercise Vital Sign    Days of Exercise per Week: 3 days    Minutes of Exercise per Session: 50 min  Stress: No Stress Concern Present (08/02/2022)   [employer] of Occupational Health - Occupational Stress Questionnaire    Feeling of Stress : Not at all  Social Connections: Moderately Isolated (08/02/2022)   Social Connection and Isolation Panel [NHANES]    Frequency of Communication with Friends and Family: More than three times a week    Frequency of Social Gatherings with Friends and Family: Twice a week    Attends Religious Services: 1 to 4 times per year    Active Member of [employer] or Organizations: No    Attends Banker Meetings: Never    Marital Status: Divorced   Family History  Problem Relation Age of Onset   Cancer Mother        colon   Other Mother        aneursym   Cancer Brother        colon   Cancer Sister        bone,breast   Suicidality Daughter    Past Surgical History:  Procedure Laterality Date   AUGMENTATION MAMMAPLASTY     CATARACT EXTRACTION W/PHACO Right 11/23/2020   Procedure: CATARACT EXTRACTION  PHACO AND INTRAOCULAR LENS PLACEMENT RIGHT EYE;  Surgeon: Fabio Pierce, MD;  Location: [facility];  Service: Ophthalmology;  Laterality: Right;  right CDE=28.15   CATARACT EXTRACTION W/PHACO Left 12/14/2020   Procedure: CATARACT EXTRACTION PHACO AND INTRAOCULAR LENS PLACEMENT (IOC);  Surgeon: Fabio Pierce, MD;  Location: [facility];  Service: Ophthalmology;  Laterality: Left;  CDE: 20.82   CHOLECYSTECTOMY     COLONOSCOPY N/A 04/06/2015   Procedure: COLONOSCOPY;  Surgeon: Corbin Ade, MD;  [facility];  Service: Endoscopy;  Laterality: N/A;  8:45   COLONOSCOPY N/A 09/02/2020   Procedure: COLONOSCOPY;  Surgeon: Corbin Ade, MD;  [facility];  Service: Endoscopy;  Laterality: N/A;  1:00pm   EXCISION MORTON'S NEUROMA Left 12/09/2015  Procedure: EXCISION NEUROMA LEFT FOOT;  Surgeon: Ferman Hamming, DPM;  Location: [facility];  Service: Podiatry;  Laterality: Left;   POLYPECTOMY  09/02/2020   Procedure: POLYPECTOMY;  Surgeon: Corbin Ade, MD;  [facility];  Service: Endoscopy;;   TUBAL LIGATION     veins     had varicose veins done       Mardella Layman, MD 03/04/23 1321

## 2023-03-07 ENCOUNTER — Telehealth: Payer: Self-pay | Admitting: Emergency Medicine

## 2023-03-07 ENCOUNTER — Ambulatory Visit
Admission: EM | Admit: 2023-03-07 | Discharge: 2023-03-07 | Disposition: A | Discharge status: Home / Self Care | Payer: PPO | Attending: Nurse Practitioner | Admitting: Nurse Practitioner

## 2023-03-07 ENCOUNTER — Encounter: Payer: Self-pay | Admitting: Emergency Medicine

## 2023-03-07 DIAGNOSIS — M5432 Sciatica, left side: Secondary | ICD-10-CM

## 2023-03-07 MED ORDER — METHYLPREDNISOLONE SODIUM SUCC 125 MG IJ SOLR
60.0000 mg | Freq: Once | INTRAMUSCULAR | Status: AC
Start: 2023-03-07 — End: 2023-03-07
  Administered 2023-03-07: 60 mg via INTRAMUSCULAR

## 2023-03-07 MED ORDER — PREDNISONE 20 MG PO TABS: 40.0000 mg | ORAL_TABLET | Freq: Every day | ORAL | 0 refills | Status: DC

## 2023-03-07 NOTE — ED Triage Notes (Signed)
Was seen on 5/4 for knee pain.  Now having left hip pain that radiates down left leg.  States leg just aches.  States she continues to have the knee pain.

## 2023-03-07 NOTE — Discharge Instructions (Signed)
Take medication as prescribed.  Monitor your blood pressure while taking the medication.  If the top number of your blood pressure is greater than 180, stop the medication. Recommend Tylenol arthritis strength 650 mg tablets every 8 hours as needed for pain or discomfort. Continue the use of ice or heat as needed.  Apply ice for pain or swelling, heat for spasm or stiffness.  Apply for 20 minutes, remove for 1 hour, then repeat as needed. Gentle stretching and range of motion exercises while symptoms persist.  I am providing you with exercises that he can perform at home. If symptoms do not improve, would like for you to follow-up with your primary care physician for further evaluation.  I am providing you with information for a local primary care physician in this area for you to establish care. Go to the emergency department immediately if you develop weakness in the lower extremities, become unable to walk, or you develop loss of bowel or bladder function. Follow-up as needed.

## 2023-03-07 NOTE — ED Provider Notes (Addendum)
[facility]    CSN: [id number] Arrival date & time: 03/07/23  1735      History   Chief Complaint No chief complaint on file.   HPI Misty Wilson is a 82 y.o. female.   The history is provided by the patient.   The patient presents for complaints of left hip pain that is been present for the past several days.  Patient states she was seen in this clinic a few days ago because of pain in the left knee.  She states since that time, she has developed pain that begins in the left buttocks, and radiates down the back of the left leg.  She states pain worsens with walking.  Patient denies any injury or trauma, loss of bowel or bladder function, numbness, or tingling.  Patient reports she has tried ice, and ibuprofen for her pain with minimal relief.  Patient continues to complain of pain in the left knee.  Past Medical History:  Diagnosis Date   Breast mass, left 04/25/2013   Multiple gastric ulcers     Patient Active Problem List   Diagnosis Date Noted   Encounter for screening fecal occult blood testing 08/02/2022   Routine Papanicolaou smear 08/02/2022   ASCUS of cervix with negative high risk HPV 08/02/2022   Encounter for gynecological examination with Papanicolaou smear of cervix 08/02/2022   Hypertension 08/02/2022   Screening mammogram for breast cancer 08/02/2022   Pessary maintenance 03/19/2021   Ankle swelling 03/19/2021   FH: colon cancer 07/21/2020   Cystocele with uterine prolapse 04/07/2020   Screening for colorectal cancer 04/07/2020   Encounter for fitting and adjustment of pessary 04/07/2020   Elevated cholesterol with elevated triglycerides 09/18/2018   Elevated BP without diagnosis of hypertension 09/18/2018   History of colonic polyps    Diverticulosis of colon without hemorrhage    Breast mass, left 04/25/2013   TIBIALIS TENDINITIS 04/27/2009   FOOT PAIN, RIGHT 11/20/2008    Past Surgical History:  Procedure Laterality Date    AUGMENTATION MAMMAPLASTY     CATARACT EXTRACTION W/PHACO Right 11/23/2020   Procedure: CATARACT EXTRACTION PHACO AND INTRAOCULAR LENS PLACEMENT RIGHT EYE;  Surgeon: Fabio Pierce, MD;  Location: [facility];  Service: Ophthalmology;  Laterality: Right;  right CDE=28.15   CATARACT EXTRACTION W/PHACO Left 12/14/2020   Procedure: CATARACT EXTRACTION PHACO AND INTRAOCULAR LENS PLACEMENT (IOC);  Surgeon: Fabio Pierce, MD;  Location: [facility];  Service: Ophthalmology;  Laterality: Left;  CDE: 20.82   CHOLECYSTECTOMY     COLONOSCOPY N/A 04/06/2015   Procedure: COLONOSCOPY;  Surgeon: Corbin Ade, MD;  Location: [facility];  Service: Endoscopy;  Laterality: N/A;  8:45   COLONOSCOPY N/A 09/02/2020   Procedure: COLONOSCOPY;  Surgeon: Corbin Ade, MD;  Location: [facility];  Service: Endoscopy;  Laterality: N/A;  1:00pm   EXCISION MORTON'S NEUROMA Left 12/09/2015   Procedure: EXCISION NEUROMA LEFT FOOT;  Surgeon: Ferman Hamming, DPM;  Location: [facility];  Service: Podiatry;  Laterality: Left;   POLYPECTOMY  09/02/2020   Procedure: POLYPECTOMY;  Surgeon: Corbin Ade, MD;  Location: [facility];  Service: Endoscopy;;   TUBAL LIGATION     veins     had varicose veins done    OB History     Gravida  4   Para  4   Term      Preterm      AB      Living  SAB      IAB      Ectopic      Multiple      Live Births               Home Medications    Prior to Admission medications   Medication Sig Start Date End Date Taking? Authorizing Provider  Ascorbic Acid (VITAMIN C) 1000 MG tablet Take 2,000 mg by mouth daily.    [provider]  aspirin EC 81 MG tablet Take 81 mg by mouth every evening. Swallow whole.    [provider]  Calcium-Magnesium-Vitamin D (CALCIUM 1200+D3 PO) Take 1 tablet by mouth daily.    [provider]  cholecalciferol (VITAMIN D) 25 MCG (1000 UNIT) tablet Take 1,000 Units by mouth daily.    [provider]   Ginkgo Biloba 120 MG CAPS Take 120 mg by mouth daily.    [provider]  hydrochlorothiazide (MICROZIDE) 12.5 MG capsule TAKE ONE CAPSULE BY MOUTH DAILY 07/08/22   [name], NP  Krill Oil 350 MG CAPS Take 350 mg by mouth daily.    [provider]  Turmeric 500 MG CAPS Take 5,000 mg by mouth daily.    [provider]  vitamin B-12 (CYANOCOBALAMIN) 1000 MCG tablet Take 1,000 mcg by mouth daily.    [provider]    Family History Family History  Problem Relation Age of Onset   Cancer Mother        colon   Other Mother        aneursym   Cancer Brother        colon   Cancer Sister        bone,breast   Suicidality Daughter     Social History Social History   Tobacco Use   Smoking status: Light Smoker    Packs/day: 0.10    Years: 12.00    Additional pack years: 0.00    Total pack years: 1.20    Types: Cigarettes   Smokeless tobacco: Never   Tobacco comments:    smokes 1 pack per week  Vaping Use   Vaping Use: Never used  Substance Use Topics   Alcohol use: No   Drug use: No     Allergies   Codeine, Other, and Prednisone   Review of Systems Review of Systems Per HPI  Physical Exam Triage Vital Signs ED Triage Vitals  Enc Vitals Group     BP 03/07/23 1742 (!) 152/86     Pulse Rate 03/07/23 1742 74     Resp 03/07/23 1742 18     Temp 03/07/23 1742 98.6 F (37 C)     Temp Source 03/07/23 1742 Oral     SpO2 03/07/23 1742 95 %     Weight --      Height --      Head Circumference --      Peak Flow --      Pain Score 03/07/23 1743 5     Pain Loc --      Pain Edu? --      Excl. in GC? --    No data found.  Updated Vital Signs BP (!) 152/86 (BP Location: Right Arm)   Pulse 74   Temp 98.6 F (37 C) (Oral)   Resp 18   SpO2 95%   Visual Acuity Right Eye Distance:   Left Eye Distance:   Bilateral Distance:    Right Eye Near:   Left Eye Near:  Bilateral Near:     Physical Exam Vitals and nursing  note reviewed.  Constitutional:      General: She is not in acute distress.    Appearance: Normal appearance.  HENT:     Head: Normocephalic.  Eyes:     Extraocular Movements: Extraocular movements intact.     Pupils: Pupils are equal, round, and reactive to light.  Cardiovascular:     Rate and Rhythm: Normal rate and regular rhythm.     Pulses: Normal pulses.     Heart sounds: Normal heart sounds.  Pulmonary:     Effort: Pulmonary effort is normal.     Breath sounds: Normal breath sounds.  Abdominal:     General: Bowel sounds are normal.     Palpations: Abdomen is soft.  Musculoskeletal:     Cervical back: Normal range of motion.     Left hip: No deformity. Decreased range of motion. Normal strength.     Comments: Tenderness noted in the left buttock along the left sciatic nerve.  Lymphadenopathy:     Cervical: No cervical adenopathy.  Skin:    General: Skin is warm and dry.  Neurological:     General: No focal deficit present.     Mental Status: She is alert and oriented to person, place, and time.  Psychiatric:        Mood and Affect: Mood normal.        Behavior: Behavior normal.      UC Treatments / Results  Labs (all labs ordered are listed, but only abnormal results are displayed) Labs Reviewed - No data to display  EKG   Radiology No results found.  Procedures Procedures (including critical care time)  Medications Ordered in UC Medications  methylPREDNISolone sodium succinate (SOLU-MEDROL) 125 mg/2 mL injection 60 mg (60 mg Intramuscular Given 03/07/23 1803)    Initial Impression / Assessment and Plan / UC Course  I have reviewed the triage vital signs and the nursing notes.  Pertinent labs & imaging results that were available during my care of the patient were reviewed by me and considered in my medical decision making (see chart for details).  The patient is well-appearing, she is in no acute distress, vital signs are stable.  Symptoms appear to  be consistent with left-sided sciatica.  Will treat with Solu-Medrol 60 mg IM.  Patient was offered prednisone for inflammation; however, she declined stating that she had an episode of elevated blood pressure that caused her to blackout in the past.  Patient will proceed with supportive care recommendations, recommendations were provided and discussed with the patient to include use of over-the-counter analgesics for pain or discomfort, stretching, and the use of ice or heat.  Patient was given strict ER follow-up precautions.  Patient was also provided information for a local PCP office in this area to establish care.  Patient was advised that if symptoms do not improve, would like for her to follow-up with her PCP for further evaluation.  Patient is in agreement with this plan of care and verbalizes understanding.  All questions were answered.  Patient stable for discharge.   Final Clinical Impressions(s) / UC Diagnoses   Final diagnoses:  Left sided sciatica     Discharge Instructions      Take medication as prescribed.  Monitor your blood pressure while taking the medication.  If the top number of your blood pressure is greater than 180, stop the medication. Recommend Tylenol arthritis strength 650 mg tablets every 8  hours as needed for pain or discomfort. Continue the use of ice or heat as needed.  Apply ice for pain or swelling, heat for spasm or stiffness.  Apply for 20 minutes, remove for 1 hour, then repeat as needed. Gentle stretching and range of motion exercises while symptoms persist.  I am providing you with exercises that he can perform at home. If symptoms do not improve, would like for you to follow-up with your primary care physician for further evaluation.  I am providing you with information for a local primary care physician in this area for you to establish care. Go to the emergency department immediately if you develop weakness in the lower extremities, become unable to  walk, or you develop loss of bowel or bladder function. Follow-up as needed.     ED Prescriptions     Medication Sig Dispense Auth. Provider   predniSONE (DELTASONE) 20 MG tablet  (Status: Discontinued) Take 2 tablets (40 mg total) by mouth daily with breakfast for 5 days. 10 tablet Taneika Choi-Warren, [name], NP      PDMP not reviewed this encounter.   Abran Cantor, NP 03/07/23 1805    Abran Cantor, NP 03/07/23 [phone]

## 2023-03-13 ENCOUNTER — Ambulatory Visit: Payer: PPO

## 2023-03-21 ENCOUNTER — Telehealth: Payer: Self-pay | Admitting: Family Medicine

## 2023-03-21 NOTE — Telephone Encounter (Signed)
Patient scheduled appointment to see Dr Durwin Nora.

## 2023-03-21 NOTE — Telephone Encounter (Signed)
Patient called asking to switch providers from [facility] to [facility], seen a flyer of Dr Durwin Nora he use to do sport medicine. Okay for patient to switch over to Dr Durwin Nora , she has knee problems.

## 2023-03-23 ENCOUNTER — Ambulatory Visit (INDEPENDENT_AMBULATORY_CARE_PROVIDER_SITE_OTHER): Payer: PPO

## 2023-03-23 ENCOUNTER — Ambulatory Visit
Admission: RE | Admit: 2023-03-23 | Discharge: 2023-03-23 | Disposition: A | Discharge status: Home / Self Care | Payer: PPO | Source: Ambulatory Visit | Attending: Nurse Practitioner | Admitting: Nurse Practitioner

## 2023-03-23 ENCOUNTER — Other Ambulatory Visit: Payer: PPO

## 2023-03-23 VITALS — BP 147/83 | HR 74 | Temp 98.4°F | Resp 20

## 2023-03-23 DIAGNOSIS — M25562 Pain in left knee: Secondary | ICD-10-CM | POA: Diagnosis not present

## 2023-03-23 MED ORDER — METHYLPREDNISOLONE 4 MG PO TBPK: ORAL_TABLET | ORAL | 0 refills | Status: DC

## 2023-03-23 NOTE — ED Triage Notes (Signed)
Pt reports she has left knee swelling and pain x 3 weeks. Reports no injury.

## 2023-03-23 NOTE — Discharge Instructions (Addendum)
The x-ray today shows there is fluid in your right knee.  This is likely contributing to your pain.  Since your knee is warm, I am suspicious for possible gout.  Please take the Medrol Dosepak as prescribed.  Monitor your blood pressure closely and discontinue the medicine if your blood pressure becomes elevated.  Follow-up with Dr. Durwin Nora as planned with no improvement in symptoms.

## 2023-03-23 NOTE — ED Provider Notes (Signed)
[facility]    CSN: [id number] Arrival date & time: 03/23/23  1415      History   Chief Complaint Chief Complaint  Patient presents with   Knee Pain    Entered by patient    HPI Misty Wilson is a 82 y.o. female.   Patient presents today with 3-week history of left knee pain.  Denies recent accident, fall, trauma, or injury to the knee that she knows of.  Reports prior to pain starting, she was planting flowers in her flower bed.  Reports the knee pain is diffusely around the entire knee.  Pain is worse with movement or weightbearing.  Has tried ice, NSAIDs, Ace wrap, and rest which all do seem to help, however pain has persisted.  She denies weakness with weightbearing or walking, sensation of giving way, locking, popping, or bruising.  She reports the knee is swollen, slightly red, and warm to touch.  No numbness or tingling going down the leg to the feet, fever, nausea/vomiting.  Has been seen twice in urgent care for similar complaint and treated with dexamethasone and Solu-Medrol respectively.  She reports she had a syncopal episode when she took prednisone in the past, however has never taken oral methylprednisolone.  Reports he has follow-up with Dr. Durwin Nora with sports medicine 04/04/2023.  Denies personal history of gout.    Past Medical History:  Diagnosis Date   Breast mass, left 04/25/2013   Multiple gastric ulcers     Patient Active Problem List   Diagnosis Date Noted   Encounter for screening fecal occult blood testing 08/02/2022   Routine Papanicolaou smear 08/02/2022   ASCUS of cervix with negative high risk HPV 08/02/2022   Encounter for gynecological examination with Papanicolaou smear of cervix 08/02/2022   Hypertension 08/02/2022   Screening mammogram for breast cancer 08/02/2022   Pessary maintenance 03/19/2021   Ankle swelling 03/19/2021   FH: colon cancer 07/21/2020   Cystocele with uterine prolapse 04/07/2020   Screening for colorectal  cancer 04/07/2020   Encounter for fitting and adjustment of pessary 04/07/2020   Elevated cholesterol with elevated triglycerides 09/18/2018   Elevated BP without diagnosis of hypertension 09/18/2018   History of colonic polyps    Diverticulosis of colon without hemorrhage    Breast mass, left 04/25/2013   TIBIALIS TENDINITIS 04/27/2009   FOOT PAIN, RIGHT 11/20/2008    Past Surgical History:  Procedure Laterality Date   AUGMENTATION MAMMAPLASTY     CATARACT EXTRACTION W/PHACO Right 11/23/2020   Procedure: CATARACT EXTRACTION PHACO AND INTRAOCULAR LENS PLACEMENT RIGHT EYE;  Surgeon: Fabio Pierce, MD;  Location: [facility];  Service: Ophthalmology;  Laterality: Right;  right CDE=28.15   CATARACT EXTRACTION W/PHACO Left 12/14/2020   Procedure: CATARACT EXTRACTION PHACO AND INTRAOCULAR LENS PLACEMENT (IOC);  Surgeon: Fabio Pierce, MD;  Location: [facility];  Service: Ophthalmology;  Laterality: Left;  CDE: 20.82   CHOLECYSTECTOMY     COLONOSCOPY N/A 04/06/2015   Procedure: COLONOSCOPY;  Surgeon: Corbin Ade, MD;  Location: [facility];  Service: Endoscopy;  Laterality: N/A;  8:45   COLONOSCOPY N/A 09/02/2020   Procedure: COLONOSCOPY;  Surgeon: Corbin Ade, MD;  Location: [facility];  Service: Endoscopy;  Laterality: N/A;  1:00pm   EXCISION MORTON'S NEUROMA Left 12/09/2015   Procedure: EXCISION NEUROMA LEFT FOOT;  Surgeon: Ferman Hamming, DPM;  Location: [facility];  Service: Podiatry;  Laterality: Left;   POLYPECTOMY  09/02/2020   Procedure: POLYPECTOMY;  Surgeon: Corbin Ade, MD;  Location: [facility];  Service: Endoscopy;;   TUBAL LIGATION     veins     had varicose veins done    OB History     Gravida  4   Para  4   Term      Preterm      AB      Living         SAB      IAB      Ectopic      Multiple      Live Births               Home Medications    Prior to Admission medications   Medication Sig Start Date End Date Taking? Authorizing  Provider  methylPREDNISolone (MEDROL DOSEPAK) 4 MG TBPK tablet Use as directed on package. 03/23/23  Yes Valentino Nose, NP  Ascorbic Acid (VITAMIN C) 1000 MG tablet Take 2,000 mg by mouth daily.    [provider]  aspirin EC 81 MG tablet Take 81 mg by mouth every evening. Swallow whole.    [provider]  Calcium-Magnesium-Vitamin D (CALCIUM 1200+D3 PO) Take 1 tablet by mouth daily.    [provider]  cholecalciferol (VITAMIN D) 25 MCG (1000 UNIT) tablet Take 1,000 Units by mouth daily.    [provider]  Ginkgo Biloba 120 MG CAPS Take 120 mg by mouth daily.    [provider]  hydrochlorothiazide (MICROZIDE) 12.5 MG capsule TAKE ONE CAPSULE BY MOUTH DAILY 07/08/22   [name], NP  Krill Oil 350 MG CAPS Take 350 mg by mouth daily.    [provider]  Turmeric 500 MG CAPS Take 5,000 mg by mouth daily.    [provider]  vitamin B-12 (CYANOCOBALAMIN) 1000 MCG tablet Take 1,000 mcg by mouth daily.    [provider]    Family History Family History  Problem Relation Age of Onset   Cancer Mother        colon   Other Mother        aneursym   Cancer Brother        colon   Cancer Sister        bone,breast   Suicidality Daughter     Social History Social History   Tobacco Use   Smoking status: Light Smoker    Packs/day: 0.10    Years: 12.00    Additional pack years: 0.00    Total pack years: 1.20    Types: Cigarettes   Smokeless tobacco: Never   Tobacco comments:    smokes 1 pack per week  Vaping Use   Vaping Use: Never used  Substance Use Topics   Alcohol use: No   Drug use: No     Allergies   Codeine, Other, and Prednisone   Review of Systems Review of Systems Per HPI  Physical Exam Triage Vital Signs ED Triage Vitals  Enc Vitals Group     BP 03/23/23 1422 (!) 147/83     Pulse Rate 03/23/23 1422 74     Resp 03/23/23 1422 20     Temp 03/23/23 1422 98.4 F (36.9 C)      Temp Source 03/23/23 1422 Oral     SpO2 03/23/23 1422 95 %     Weight --      Height --      Head Circumference --      Peak Flow --      Pain Score  03/23/23 1421 10     Pain Loc --      Pain Edu? --      Excl. in GC? --    No data found.  Updated Vital Signs BP (!) 147/83 (BP Location: Right Arm)   Pulse 74   Temp 98.4 F (36.9 C) (Oral)   Resp 20   SpO2 95%   Visual Acuity Right Eye Distance:   Left Eye Distance:   Bilateral Distance:    Right Eye Near:   Left Eye Near:    Bilateral Near:     Physical Exam Vitals and nursing note reviewed.  Constitutional:      General: She is not in acute distress.    Appearance: Normal appearance. She is not toxic-appearing.  HENT:     Mouth/Throat:     Mouth: Mucous membranes are moist.     Pharynx: Oropharynx is clear.  Pulmonary:     Effort: Pulmonary effort is normal. No respiratory distress.  Musculoskeletal:     Left knee: Swelling present. No erythema, ecchymosis or lacerations. Normal range of motion. Tenderness present over the medial joint line and lateral joint line.     Comments: Inspection: mild swelling and warmth to the left knee, no obvious deformity or redness Palpation: left knee diffusely tender to palpation along joint spaces and inferiorly; no obvious deformities palpated ROM: Full ROM to left and right knees, no laxity appreciated Strength: 5/5 bilateral lower extremities Neurovascular: neurovascularly intact in left and right lower extremity   Skin:    General: Skin is warm and dry.     Capillary Refill: Capillary refill takes less than 2 seconds.     Coloration: Skin is not jaundiced or pale.     Findings: No erythema.  Neurological:     Mental Status: She is alert and oriented to person, place, and time.  Psychiatric:        Behavior: Behavior is cooperative.      UC Treatments / Results  Labs (all labs ordered are listed, but only abnormal results are displayed) Labs Reviewed - No data to  display  EKG   Radiology DG Knee Complete 4 Views Left  Result Date: 03/23/2023 CLINICAL DATA:  Pain and swelling over the last 3 weeks EXAM: LEFT KNEE - COMPLETE 4+ VIEW COMPARISON:  None Available. FINDINGS: Small knee joint effusion. No weight-bearing compartment or patellofemoral compartment joint space narrowing or degenerative change. No focal bone lesion. IMPRESSION: Small knee joint effusion. No other significant finding. Electronically Signed   By: Paulina Fusi M.D.   On: 03/23/2023 15:01    Procedures Procedures (including critical care time)  Medications Ordered in UC Medications - No data to display  Initial Impression / Assessment and Plan / UC Course  I have reviewed the triage vital signs and the nursing notes.  Pertinent labs & imaging results that were available during my care of the patient were reviewed by me and considered in my medical decision making (see chart for details).   Patient is well-appearing, normotensive, afebrile, not tachycardic, not tachypneic, oxygenating well on room air.    1. Acute pain of left knee Knee x-ray today shows small joint effusion, otherwise no bony abnormality Will treat with Medrol Dosepak to help with inflammatory process, slightly suspicious for gout however out of the timeframe for colchicine Supportive care discussed with patient and follow-up discussed with patient  The patient was given the opportunity to ask questions.  All questions answered to their satisfaction.  The patient is in agreement to this plan.    Final Clinical Impressions(s) / UC Diagnoses   Final diagnoses:  Acute pain of left knee     Discharge Instructions      The x-ray today shows there is fluid in your right knee.  This is likely contributing to your pain.  Since your knee is warm, I am suspicious for possible gout.  Please take the Medrol Dosepak as prescribed.  Monitor your blood pressure closely and discontinue the medicine if your blood  pressure becomes elevated.  Follow-up with Dr. Durwin Nora as planned with no improvement in symptoms.     ED Prescriptions     Medication Sig Dispense Auth. Provider   methylPREDNISolone (MEDROL DOSEPAK) 4 MG TBPK tablet Use as directed on package. 21 tablet Valentino Nose, NP      PDMP not reviewed this encounter.   Valentino Nose, NP 03/23/23 [phone]

## 2023-04-04 ENCOUNTER — Encounter: Payer: Self-pay | Admitting: Internal Medicine

## 2023-04-04 ENCOUNTER — Ambulatory Visit (INDEPENDENT_AMBULATORY_CARE_PROVIDER_SITE_OTHER): Payer: PPO | Admitting: Internal Medicine

## 2023-04-04 VITALS — BP 130/72 | HR 78 | Ht 65.0 in | Wt 177.6 lb

## 2023-04-04 DIAGNOSIS — M25562 Pain in left knee: Secondary | ICD-10-CM | POA: Insufficient documentation

## 2023-04-04 NOTE — Progress Notes (Signed)
   Acute Office Visit  Subjective:     Patient ID: Misty Wilson, female    DOB: [birth year], 82 y.o.   MRN: [id number]  Chief Complaint  Patient presents with   Leg Pain    Went to UC and was told it was the sciatic nerve, was given meds but still having pain. Pain in left leg. Started on 03/15/2023.   Misty Wilson presents today for an acute visit endorsing a 1 month history of left posterior knee pain.  She denies inciting event or trauma at the onset of pain.  Pain began after working on her knees and sitting with her left knee crossed under her right while sitting on the couch watching television.  She presented to urgent care for evaluation on 5/23.  X-rays obtained at that time revealed a small joint effusion but were otherwise negative for acute findings.  She was treated with a Medrol Dosepak, which has improved her pain by 80%.  Pain is most intense along the posterior aspect of the left knee and radiates proximally to the left buttock.  She has been taking Tylenol as needed for pain relief.  She has not identified any specific exacerbating or alleviating factors.  Review of Systems  Musculoskeletal:  Positive for joint pain (Left posterior knee pain).      Objective:    BP 130/72   Pulse 78   Ht 5\' 5"  (1.651 m)   Wt 177 lb 9.6 oz (80.6 kg)   SpO2 96%   BMI 29.55 kg/m   Physical Exam Musculoskeletal:        General: Normal range of motion.     Comments: No obvious deformity on inspection of the left knee.  There is tenderness palpation along the posterior aspect of the left knee.  No medial or lateral joint line tenderness.  ROM of the left knee is generally intact.  Negative anterior drawer/Lachman's.  Negative McMurray/Thessaly's.  Negative patellar grind.  Skin:    General: Skin is warm and dry.       Assessment & Plan:   Problem List Items Addressed This Visit       Posterior knee pain, left - Primary    Presenting today for an acute visit endorsing a 1 month  history of left posterior knee pain as described above.  POCUS performed in clinic today by Dr. Barbaraann Faster reveals a potential Baker's cyst, which could explain her pain. -We will tentatively plan for follow-up with Dr. Barbaraann Faster tomorrow (6/5) for intra-articular corticosteroid injection of the left knee.  She was provided with home PT exercises today and instructed to continue as needed use of Tylenol for pain relief. -Follow-up with me in 3 months for routine care      Return in about 3 months (around 07/05/2023).  Billie Lade, MD

## 2023-04-04 NOTE — Patient Instructions (Signed)
It was a pleasure to see you today.  Thank you for giving [place] the opportunity to be involved in your care.  Below is a brief recap of your visit and next steps.  We will plan to see you again in 3 months.  Summary Please see the attached exercises for your knee. OK to use tylenol and ibuprofen as needed for pain relief. I will discuss ultrasound results with colleagues and follow up with you on next steps.

## 2023-04-04 NOTE — Assessment & Plan Note (Signed)
Presenting today for an acute visit endorsing a 1 month history of left posterior knee pain as described above.  POCUS performed in clinic today by Dr. Barbaraann Faster reveals a potential Baker's cyst, which could explain her pain. -We will tentatively plan for follow-up with Dr. Barbaraann Faster tomorrow (6/5) for intra-articular corticosteroid injection of the left knee.  She was provided with home PT exercises today and instructed to continue as needed use of Tylenol for pain relief. -Follow-up with me in 3 months for routine care

## 2023-04-05 ENCOUNTER — Encounter: Payer: Self-pay | Admitting: Internal Medicine

## 2023-04-05 ENCOUNTER — Ambulatory Visit (INDEPENDENT_AMBULATORY_CARE_PROVIDER_SITE_OTHER): Payer: PPO | Admitting: Internal Medicine

## 2023-04-05 VITALS — BP 141/81 | HR 67 | Resp 16 | Ht 65.0 in | Wt 177.0 lb

## 2023-04-05 DIAGNOSIS — M25562 Pain in left knee: Secondary | ICD-10-CM | POA: Diagnosis not present

## 2023-04-05 MED ORDER — TRIAMCINOLONE ACETONIDE 40 MG/ML IJ SUSP
40.0000 mg | Freq: Once | INTRAMUSCULAR | Status: AC
  Administered 2023-04-05: 40 mg via INTRA_ARTICULAR

## 2023-04-05 MED ORDER — BUPIVACAINE HCL (PF) 0.25 % IJ SOLN
4.0000 mL | Freq: Once | INTRAMUSCULAR | Status: AC
Start: 2023-04-05 — End: 2023-04-05
  Administered 2023-04-05: 4 mL

## 2023-04-05 MED ORDER — LIDOCAINE HCL 1 % IJ SOLN
2.0000 mL | Freq: Once | INTRAMUSCULAR | Status: AC
  Administered 2023-04-05: 2 mL

## 2023-04-05 NOTE — Patient Instructions (Signed)
Thank you, Ms.Dessie A Uthoff for allowing [place] to provide your care today.   Refrain from overuse over the next 3 days. Go to the emergency room with any usual pain, swelling, or redness occurred in the injected area.  Follow up if your pain and swelling is not improved in a week.       Thurmon Fair, M.D.

## 2023-04-05 NOTE — Progress Notes (Unsigned)
   HPI:[name] is a 82 y.o. female who presents for corticosteroid injection of left knee. For the details of today's visit, please refer to the assessment and plan.  Physical Exam: Patient declined vitals because this was done yesterday   Physical Exam Musculoskeletal:     Comments: Knee: - Inspection: No gross deformity. Mild swelling, most noticeable in posterior knee. No erythema or bruising. Skin intact - Palpation: TTP over posterior knee - ROM: full active ROM with flexion and extension in knee - Strength: 5/5 strength        Assessment & Plan:   There are no diagnoses linked to this encounter.    Milus Banister, MD

## 2023-04-06 NOTE — Assessment & Plan Note (Addendum)
Patient has minimal effusion on POCUS. No aspiration performed.   Aspiration/Injection Procedure Note Misty Wilson Jul 11, 1941  Procedure: Injection Indications: left knee pain Risks of procedure as well as the alternatives and risks of each were explained to the patient. Consent for procedure obtained.    After a time out was preformed, the knee was prepped in a sterile fashion. Cold spray was applied to the skin over the insertion site. The left knee superior lateral suprapatellar pouch was injected using 2 cc of 1% lidocaine on a 23-gauge 1-1/2 inch needle.  The syringe was switched to mixture containing 1 cc's of 40 mg Kenalog and 4 cc's of .25% Bupivacaine was injected.  Ultrasound was used. A sterile dressing was applied. The patient tolerated the procedures well without complication.

## 2023-04-12 ENCOUNTER — Telehealth: Payer: Self-pay | Admitting: Internal Medicine

## 2023-04-12 DIAGNOSIS — M25562 Pain in left knee: Secondary | ICD-10-CM

## 2023-04-12 NOTE — Telephone Encounter (Signed)
Patient called in regard to appointment on 6/5 with Barbaraann Faster  States that knee is not getting better. Wants a cll back in regard.

## 2023-04-12 NOTE — Telephone Encounter (Signed)
Called patient.She did not find improvement with steroid injection for knee pain. She continues to have knee pain. She is taking ibuprofen and notice black stools for 2 days. She will stop taking ibuprofen. I recommended ice for swelling of knee and tylenol for pain.  She is not having any lightheaded feeling or dizziness. Given no improvement in knee pain I have ordered MRI for further evaluation. I have requested patient be scheduled for follow up. She will go to ER if her melena does not resolve or she becomes symptomatic.

## 2023-04-13 NOTE — Telephone Encounter (Signed)
Scheduled with Dr Durwin Nora 06.18.2024.

## 2023-04-18 ENCOUNTER — Encounter: Payer: Self-pay | Admitting: Internal Medicine

## 2023-04-18 ENCOUNTER — Ambulatory Visit (INDEPENDENT_AMBULATORY_CARE_PROVIDER_SITE_OTHER): Payer: PPO | Admitting: Internal Medicine

## 2023-04-18 VITALS — BP 143/79 | HR 85 | Ht 65.0 in | Wt 176.0 lb

## 2023-04-18 DIAGNOSIS — M25562 Pain in left knee: Secondary | ICD-10-CM | POA: Diagnosis not present

## 2023-04-18 DIAGNOSIS — Z8719 Personal history of other diseases of the digestive system: Secondary | ICD-10-CM

## 2023-04-18 MED ORDER — PANTOPRAZOLE SODIUM 40 MG PO TBEC
40.0000 mg | DELAYED_RELEASE_TABLET | Freq: Every day | ORAL | 2 refills | Status: DC
Start: 2023-04-18 — End: 2023-07-18

## 2023-04-18 NOTE — Assessment & Plan Note (Signed)
She endorses recent melena that has now resolved.  This occurred after taking ibuprofen multiple times daily in the setting of left knee pain.  She additionally endorses a history of gastric ulcers. -CBC ordered today -Start Protonix 40 mg daily

## 2023-04-18 NOTE — Patient Instructions (Signed)
It was a pleasure to see you today.  Thank you for giving [place] the opportunity to be involved in your care.  Below is a brief recap of your visit and next steps.  We will plan to see you again in September.  Summary Cancel MRI  Start protonix 40 mg daily in the setting of recent bleeding Follow up in September as previously scheduled

## 2023-04-18 NOTE — Assessment & Plan Note (Signed)
Returning to care today for follow-up of posterior left knee pain.  She underwent corticosteroid injection of the left knee on 6/5.  Today she reports that pain has improved by "90%".  She is able to ambulate without difficulty and does not feel limited in performing daily activities due to pain. -Continue as needed use of Tylenol for pain relief -Will cancel MRI of the left knee given significant improvement

## 2023-04-18 NOTE — Progress Notes (Signed)
Acute Office Visit  Subjective:     Patient ID: Misty Wilson, female    DOB: 05-29-1941, 82 y.o.   MRN: [id number]  Chief Complaint  Patient presents with   Knee Pain   Misty Wilson presents today for an acute visit in the setting of recent left knee pain.  She was last evaluated by me on 6/4 for an acute visit endorsing 1 month history of left posterior knee pain.  POCUS was performed in office with the assistance of Dr. Barbaraann Faster, which demonstrated concern for a Bakers cyst.  Follow-up with Dr. Barbaraann Faster was scheduled for 6/5 where she received a corticosteroid injection in the left knee.  In the interim, she contacted our office on 6/12 reporting no significant improvement in knee pain despite injection.  She had been taking ibuprofen and developed black stools x 2 days.  She was instructed to stop ibuprofen.  An MRI of the left knee was ordered for further evaluation.  She was instructed to present to the emergency department if melena did not resolve or her symptoms worsened.  Today Misty Wilson states that her knee pain has significantly improved.  She states that it is 90% better.  She is able to ambulate without difficulty.  She has been taking 1 regular strength Tylenol daily and has stopped taking NSAIDs.  Melena has resolved.  She does not have any additional concerns to discuss today.  Review of Systems  Musculoskeletal:  Positive for joint pain (Left knee pain).      Objective:    BP (!) 143/79   Pulse 85   Ht 5\' 5"  (1.651 m)   Wt 176 lb (79.8 kg)   SpO2 98%   BMI 29.29 kg/m   Physical Exam Vitals reviewed.  Constitutional:      General: She is not in acute distress.    Appearance: Normal appearance. She is not toxic-appearing.  HENT:     Head: Normocephalic and atraumatic.     Right Ear: External ear normal.     Left Ear: External ear normal.     Nose: Nose normal. No congestion or rhinorrhea.     Mouth/Throat:     Mouth: Mucous membranes are moist.     Pharynx:  Oropharynx is clear. No oropharyngeal exudate or posterior oropharyngeal erythema.  Eyes:     General: No scleral icterus.    Extraocular Movements: Extraocular movements intact.     Conjunctiva/sclera: Conjunctivae normal.     Pupils: Pupils are equal, round, and reactive to light.  Cardiovascular:     Rate and Rhythm: Normal rate and regular rhythm.     Pulses: Normal pulses.     Heart sounds: Normal heart sounds. No murmur heard.    No friction rub. No gallop.  Pulmonary:     Effort: Pulmonary effort is normal.     Breath sounds: Normal breath sounds. No wheezing, rhonchi or rales.  Abdominal:     General: Abdomen is flat. Bowel sounds are normal. There is no distension.     Palpations: Abdomen is soft.     Tenderness: There is no abdominal tenderness.  Musculoskeletal:        General: No swelling. Normal range of motion.     Cervical back: Normal range of motion.     Right lower leg: No edema.     Left lower leg: No edema.  Lymphadenopathy:     Cervical: No cervical adenopathy.  Skin:    General: Skin is warm and  dry.     Capillary Refill: Capillary refill takes less than 2 seconds.     Coloration: Skin is not jaundiced.  Neurological:     General: No focal deficit present.     Mental Status: She is alert and oriented to person, place, and time.  Psychiatric:        Mood and Affect: Mood normal.        Behavior: Behavior normal.       Assessment & Plan:   Problem List Items Addressed This Visit       Posterior knee pain, left    Returning to care today for follow-up of posterior left knee pain.  She underwent corticosteroid injection of the left knee on 6/5.  Today she reports that pain has improved by "90%".  She is able to ambulate without difficulty and does not feel limited in performing daily activities due to pain. -Continue as needed use of Tylenol for pain relief -Will cancel MRI of the left knee given significant improvement      History of melena -  Primary    She endorses recent melena that has now resolved.  This occurred after taking ibuprofen multiple times daily in the setting of left knee pain.  She additionally endorses a history of gastric ulcers. -CBC ordered today -Start Protonix 40 mg daily      Meds ordered this encounter  Medications   pantoprazole (PROTONIX) 40 MG tablet    Sig: Take 1 tablet (40 mg total) by mouth daily.    Dispense:  30 tablet    Refill:  2    Return if symptoms worsen or fail to improve.  Billie Lade, MD

## 2023-04-19 LAB — CBC WITH DIFFERENTIAL/PLATELET
Basophils Absolute: 0 10*3/uL (ref 0.0–0.2)
Basos: 0 %
EOS (ABSOLUTE): 0.1 10*3/uL (ref 0.0–0.4)
Eos: 1 %
Hematocrit: 37.8 % (ref 34.0–46.6)
Hemoglobin: 12.7 g/dL (ref 11.1–15.9)
Immature Grans (Abs): 0 10*3/uL (ref 0.0–0.1)
Immature Granulocytes: 0 %
Lymphocytes Absolute: 2.4 10*3/uL (ref 0.7–3.1)
Lymphs: 27 %
MCH: 30.1 pg (ref 26.6–33.0)
MCHC: 33.6 g/dL (ref 31.5–35.7)
MCV: 90 fL (ref 79–97)
Monocytes Absolute: 0.5 10*3/uL (ref 0.1–0.9)
Monocytes: 6 %
Neutrophils Absolute: 5.8 10*3/uL (ref 1.4–7.0)
Neutrophils: 66 %
Platelets: 270 10*3/uL (ref 150–450)
RBC: 4.22 x10E6/uL (ref 3.77–5.28)
RDW: 13 % (ref 11.7–15.4)
WBC: 8.8 10*3/uL (ref 3.4–10.8)

## 2023-04-25 ENCOUNTER — Ambulatory Visit: Payer: PPO | Admitting: Internal Medicine

## 2023-05-02 ENCOUNTER — Encounter: Payer: Self-pay | Admitting: Adult Health

## 2023-05-02 ENCOUNTER — Ambulatory Visit: Payer: PPO | Admitting: Adult Health

## 2023-05-02 VITALS — BP 133/83 | HR 62 | Ht 65.0 in | Wt 174.5 lb

## 2023-05-02 DIAGNOSIS — Z4689 Encounter for fitting and adjustment of other specified devices: Secondary | ICD-10-CM | POA: Diagnosis not present

## 2023-05-02 DIAGNOSIS — N814 Uterovaginal prolapse, unspecified: Secondary | ICD-10-CM | POA: Diagnosis not present

## 2023-05-02 DIAGNOSIS — L292 Pruritus vulvae: Secondary | ICD-10-CM

## 2023-05-02 MED ORDER — CLOTRIMAZOLE-BETAMETHASONE 1-0.05 % EX CREA: TOPICAL_CREAM | CUTANEOUS | 1 refills | Status: DC

## 2023-05-02 NOTE — Progress Notes (Signed)
  Subjective:     Patient ID: Misty Wilson, female   DOB: Mar 06, 1941, 82 y.o.   MRN: [id number]  HPI Misty Wilson is a 82 year old white female, divorced,PM, in for pessary maintenance. She had slight discharge, and vulva itching. Has not had pessary out since October 2023. She is still working PT.   PCP is Dr Durwin Nora.  Review of Systems For pessary maintenance +slight discharge and vulva itching Reviewed past medical,surgical, social and family history. Reviewed medications and allergies.     Objective:   Physical Exam BP 133/83 (BP Location: Left Arm, Patient Position: Sitting, Cuff Size: Normal)   Pulse 62   Ht 5\' 5"  (1.651 m)   Wt 174 lb 8 oz (79.2 kg)   BMI 29.04 kg/m  Skin warm and dry.Pelvic: external genitalia is normal in appearance no lesions,has clit ring, vagina:gelhorn pessary easily removed and cleaned with soap and water,slight odor, no lesions, +cystocele. ,urethra has no lesions or masses noted, cervix:smooth and bulbous, uterus: normal size, shape and contour, non tender, no masses felt, adnexa: no masses or tenderness noted. Bladder is non tender and no masses felt.  [facility] - 05/02/23 0849       Pregnancy Intention Screening   Does the patient want to become pregnant in the next year? N/A    Does the patient's partner want to become pregnant in the next year? N/A    Would the patient like to discuss contraceptive options today? N/A      Contraception Wrap Up   Current Method Female Sterilization   PM   End Method Female Sterilization   PM   Contraception Counseling Provided No            Examination chaperoned by Malachy Mood LPN     Assessment:     1. Pessary maintenance Cleaned and reinserted   2. Cystocele with uterine prolapse  3. Vulvar itching +itching, will rx Lotrisone for vulva itching  Meds ordered this encounter  Medications   clotrimazole-betamethasone (LOTRISONE) cream    Sig: Apply thin layer to external tissue bid prn    Dispense:   30 g    Refill:  1    Order Specific Question:   Supervising Provider    Answer:   Lazaro Arms [[id number]]        Plan:     Return in 6 months for pessary maintenance or sooner if needed

## 2023-05-10 ENCOUNTER — Ambulatory Visit (HOSPITAL_COMMUNITY): Payer: PPO

## 2023-06-26 ENCOUNTER — Ambulatory Visit
Admission: RE | Admit: 2023-06-26 | Discharge: 2023-06-26 | Disposition: A | Discharge status: Home / Self Care | Payer: PPO | Source: Ambulatory Visit | Attending: Nurse Practitioner | Admitting: Nurse Practitioner

## 2023-06-26 VITALS — BP 125/75 | HR 93 | Temp 98.1°F | Resp 13

## 2023-06-26 DIAGNOSIS — Z1152 Encounter for screening for COVID-19: Secondary | ICD-10-CM | POA: Insufficient documentation

## 2023-06-26 DIAGNOSIS — R110 Nausea: Secondary | ICD-10-CM | POA: Diagnosis present

## 2023-06-26 DIAGNOSIS — J069 Acute upper respiratory infection, unspecified: Secondary | ICD-10-CM | POA: Diagnosis present

## 2023-06-26 MED ORDER — GUAIFENESIN 100 MG/5ML PO LIQD: 10.0000 mL | Freq: Three times a day (TID) | ORAL | 0 refills | Status: DC | PRN

## 2023-06-26 MED ORDER — IPRATROPIUM BROMIDE 0.03 % NA SOLN: 2.0000 | Freq: Two times a day (BID) | NASAL | 12 refills | Status: DC

## 2023-06-26 NOTE — ED Provider Notes (Signed)
[facility]    CSN: [id number] Arrival date & time: 06/26/23  0932      History   Chief Complaint Chief Complaint  Patient presents with   Cough    Covid test - Entered by patient    HPI Misty Wilson is a 82 y.o. female.   The history is provided by the patient.   The patient presents for complaints of chills, headache, nasal congestion, runny nose, cough, and nausea.  Symptoms have been present for the past 3 days.  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  She is requesting a COVID test.  She denies fever, sore throat, wheezing, shortness of breath, difficulty breathing, vomiting, diarrhea, or abdominal pain.  Past Medical History:  Diagnosis Date   Breast mass, left 04/25/2013   Multiple gastric ulcers     Patient Active Problem List   Diagnosis Date Noted   Vulvar itching 05/02/2023   History of melena 04/18/2023   Posterior knee pain, left 04/04/2023   Encounter for screening fecal occult blood testing 08/02/2022   Routine Papanicolaou smear 08/02/2022   ASCUS of cervix with negative high risk HPV 08/02/2022   Encounter for gynecological examination with Papanicolaou smear of cervix 08/02/2022   Hypertension 08/02/2022   Screening mammogram for breast cancer 08/02/2022   Pessary maintenance 03/19/2021   Ankle swelling 03/19/2021   FH: colon cancer 07/21/2020   Cystocele with uterine prolapse 04/07/2020   Screening for colorectal cancer 04/07/2020   Encounter for fitting and adjustment of pessary 04/07/2020   Elevated cholesterol with elevated triglycerides 09/18/2018   Elevated BP without diagnosis of hypertension 09/18/2018   History of colonic polyps    Diverticulosis of colon without hemorrhage    Breast mass, left 04/25/2013   TIBIALIS TENDINITIS 04/27/2009   FOOT PAIN, RIGHT 11/20/2008    Past Surgical History:  Procedure Laterality Date   AUGMENTATION MAMMAPLASTY     CATARACT EXTRACTION  W/PHACO Right 11/23/2020   Procedure: CATARACT EXTRACTION PHACO AND INTRAOCULAR LENS PLACEMENT RIGHT EYE;  Surgeon: Fabio Pierce, MD;  Location: [facility];  Service: Ophthalmology;  Laterality: Right;  right CDE=28.15   CATARACT EXTRACTION W/PHACO Left 12/14/2020   Procedure: CATARACT EXTRACTION PHACO AND INTRAOCULAR LENS PLACEMENT (IOC);  Surgeon: Fabio Pierce, MD;  Location: [facility];  Service: Ophthalmology;  Laterality: Left;  CDE: 20.82   CHOLECYSTECTOMY     COLONOSCOPY N/A 04/06/2015   Procedure: COLONOSCOPY;  Surgeon: Corbin Ade, MD;  Location: [facility];  Service: Endoscopy;  Laterality: N/A;  8:45   COLONOSCOPY N/A 09/02/2020   Procedure: COLONOSCOPY;  Surgeon: Corbin Ade, MD;  Location: [facility];  Service: Endoscopy;  Laterality: N/A;  1:00pm   EXCISION MORTON'S NEUROMA Left 12/09/2015   Procedure: EXCISION NEUROMA LEFT FOOT;  Surgeon: Ferman Hamming, DPM;  Location: [facility];  Service: Podiatry;  Laterality: Left;   POLYPECTOMY  09/02/2020   Procedure: POLYPECTOMY;  Surgeon: Corbin Ade, MD;  Location: [facility];  Service: Endoscopy;;   TUBAL LIGATION     veins     had varicose veins done    OB History     Gravida  4   Para  4   Term      Preterm      AB      Living         SAB      IAB      Ectopic  Multiple      Live Births               Home Medications    Prior to Admission medications   Medication Sig Start Date End Date Taking? Authorizing Provider  guaiFENesin (ROBITUSSIN) 100 MG/5ML liquid Take 10 mLs by mouth every 8 (eight) hours as needed for cough or to loosen phlegm. 06/26/23  Yes Elya Tarquinio-Warren, [name], NP  ipratropium (ATROVENT) 0.03 % nasal spray Place 2 sprays into both nostrils every 12 (twelve) hours. 06/26/23  Yes Allea Kassner-Warren, [name], NP  Ascorbic Acid (VITAMIN C) 1000 MG tablet Take 2,000 mg by mouth daily.    [provider]  aspirin EC 81 MG tablet Take 81 mg by mouth every evening. Swallow  whole.    [provider]  Calcium-Magnesium-Vitamin D (CALCIUM 1200+D3 PO) Take 1 tablet by mouth daily.    [provider]  cholecalciferol (VITAMIN D) 25 MCG (1000 UNIT) tablet Take 1,000 Units by mouth daily.    [provider]  clotrimazole-betamethasone (LOTRISONE) cream Apply thin layer to external tissue bid prn 05/02/23   [name], NP  Ginkgo Biloba 120 MG CAPS Take 120 mg by mouth daily.    [provider]  hydrochlorothiazide (MICROZIDE) 12.5 MG capsule TAKE ONE CAPSULE BY MOUTH DAILY 07/08/22   [name], NP  Krill Oil 350 MG CAPS Take 350 mg by mouth daily.    [provider]  pantoprazole (PROTONIX) 40 MG tablet Take 1 tablet (40 mg total) by mouth daily. 04/18/23 07/17/23  Billie Lade, MD  Turmeric 500 MG CAPS Take 5,000 mg by mouth daily.    [provider]  vitamin B-12 (CYANOCOBALAMIN) 1000 MCG tablet Take 1,000 mcg by mouth daily.    [provider]    Family History Family History  Problem Relation Age of Onset   Cancer Mother        colon   Other Mother        aneursym   Cancer Brother        colon   Cancer Sister        bone,breast   Suicidality Daughter     Social History Social History   Tobacco Use   Smoking status: Light Smoker    Current packs/day: 0.10    Average packs/day: 0.1 packs/day for 12.0 years (1.2 ttl pk-yrs)    Types: Cigarettes   Smokeless tobacco: Never   Tobacco comments:    smokes 1 pack per week  Vaping Use   Vaping status: Never Used  Substance Use Topics   Alcohol use: No   Drug use: No     Allergies   Codeine, Other, and Prednisone   Review of Systems Review of Systems Per HPI  Physical Exam Triage Vital Signs ED Triage Vitals  Encounter Vitals Group     BP 06/26/23 1049 125/75     Systolic BP Percentile --      Diastolic BP Percentile --      Pulse Rate 06/26/23 1049 93     Resp 06/26/23 1049 13     Temp 06/26/23 1049 98.1 F  (36.7 C)     Temp Source 06/26/23 1049 Oral     SpO2 06/26/23 1049 97 %     Weight --      Height --      Head Circumference --      Peak Flow --      Pain Score 06/26/23  1052 0     Pain Loc --      Pain Education --      Exclude from Growth Chart --    No data found.  Updated Vital Signs BP 125/75 (BP Location: Right Arm)   Pulse 93   Temp 98.1 F (36.7 C) (Oral)   Resp 13   SpO2 97%   Visual Acuity Right Eye Distance:   Left Eye Distance:   Bilateral Distance:    Right Eye Near:   Left Eye Near:    Bilateral Near:     Physical Exam Vitals and nursing note reviewed.  Constitutional:      General: She is not in acute distress.    Appearance: Normal appearance.  HENT:     Head: Normocephalic.     Right Ear: Tympanic membrane, ear canal and external ear normal.     Left Ear: Tympanic membrane, ear canal and external ear normal.     Nose: Congestion and rhinorrhea present.     Right Turbinates: Enlarged and swollen.     Left Turbinates: Enlarged and swollen.     Right Sinus: No maxillary sinus tenderness or frontal sinus tenderness.     Left Sinus: No maxillary sinus tenderness or frontal sinus tenderness.     Mouth/Throat:     Lips: Pink.     Mouth: Mucous membranes are moist.     Pharynx: Oropharynx is clear. Uvula midline. Posterior oropharyngeal erythema and postnasal drip present. No pharyngeal swelling, oropharyngeal exudate or uvula swelling.     Comments: Cobblestoning present to posterior oropharynx Eyes:     Extraocular Movements: Extraocular movements intact.     Conjunctiva/sclera: Conjunctivae normal.     Pupils: Pupils are equal, round, and reactive to light.  Cardiovascular:     Rate and Rhythm: Normal rate and regular rhythm.     Pulses: Normal pulses.     Heart sounds: Normal heart sounds.  Pulmonary:     Effort: Pulmonary effort is normal. No respiratory distress.     Breath sounds: Normal breath sounds. No stridor. No wheezing, rhonchi or  rales.  Abdominal:     General: Bowel sounds are normal.     Palpations: Abdomen is soft.     Tenderness: There is no abdominal tenderness.  Musculoskeletal:     Cervical back: Normal range of motion.  Lymphadenopathy:     Cervical: No cervical adenopathy.  Skin:    General: Skin is warm and dry.  Neurological:     General: No focal deficit present.     Mental Status: She is alert and oriented to person, place, and time.  Psychiatric:        Mood and Affect: Mood normal.        Behavior: Behavior normal.      UC Treatments / Results  Labs (all labs ordered are listed, but only abnormal results are displayed) Labs Reviewed  SARS CORONAVIRUS 2 (TAT 6-24 HRS)    EKG   Radiology No results found.  Procedures Procedures (including critical care time)  Medications Ordered in UC Medications - No data to display  Initial Impression / Assessment and Plan / UC Course  I have reviewed the triage vital signs and the nursing notes.  Pertinent labs & imaging results that were available during my care of the patient were reviewed by me and considered in my medical decision making (see chart for details).  The patient is well-appearing, she is in no acute distress, vital signs  are stable.  COVID test is pending.  Patient is able to receive molnupiravir if her COVID test is positive.  Symptoms appear to be consistent with a viral upper respiratory infection.  Will treat symptomatically with ipratropium 0.03% nasal spray for nasal congestion, and Robitussin 100 mg for the cough.  Supportive care recommendations were provided and discussed with the patient to include over-the-counter analgesics, normal saline nasal spray, and use of a humidifier.  Patient was given instructions regarding medication and isolation if her COVID test is positive.  Patient was also advised to follow-up with her primary care if her COVID test is positive, along with the ER follow-up indications.  Patient is in  agreement with this plan of care and verbalizes understanding.  All questions were answered.  Patient stable for discharge.  Final Clinical Impressions(s) / UC Diagnoses   Final diagnoses:  Viral upper respiratory tract infection with cough  Nausea without vomiting  Encounter for screening for COVID-19     Discharge Instructions      COVID test is pending.  You will be contacted if the pending test results are positive.  If you see your results are positive on MyChart and you have not been contacted, you can contact this office. Take medication as prescribed. Increase fluids and allow for plenty of rest. May take over-the-counter Tylenol as needed for pain, fever, or general discomfort. Normal saline nasal spray throughout the day to help with nasal congestion and runny nose. For your cough, recommend using humidifier in your bedroom at nighttime during sleep and sleeping elevated on pillows while cough symptoms persist. As discussed, it is recommended that you remain home until you have received your COVID result if the result is positive and you decide to take the antiviral treatment, continue to wear your mask.  If you continue to have symptoms after completing the medication, continue to wear your mask if you develop a fever, make sure you remain home until you have been fever free for 24 hours with no medication. Go to the emergency department immediately if you experience shortness of breath, difficulty breathing, or other concerns. Please follow-up with your primary care physician to let them know of your COVID test result if it is positive. Follow-up as needed.    ED Prescriptions     Medication Sig Dispense Auth. Provider   guaiFENesin (ROBITUSSIN) 100 MG/5ML liquid Take 10 mLs by mouth every 8 (eight) hours as needed for cough or to loosen phlegm. 60 mL Claudio Mondry-Warren, [name], NP   ipratropium (ATROVENT) 0.03 % nasal spray Place 2 sprays into both nostrils every 12 (twelve)  hours. 30 mL Gilda Abboud-Warren, [name], NP      PDMP not reviewed this encounter.   Abran Cantor, NP 06/26/23 [phone]

## 2023-06-26 NOTE — Discharge Instructions (Addendum)
COVID test is pending.  You will be contacted if the pending test results are positive.  If you see your results are positive on MyChart and you have not been contacted, you can contact this office. Take medication as prescribed. Increase fluids and allow for plenty of rest. May take over-the-counter Tylenol as needed for pain, fever, or general discomfort. Normal saline nasal spray throughout the day to help with nasal congestion and runny nose. For your cough, recommend using humidifier in your bedroom at nighttime during sleep and sleeping elevated on pillows while cough symptoms persist. As discussed, it is recommended that you remain home until you have received your COVID result if the result is positive and you decide to take the antiviral treatment, continue to wear your mask.  If you continue to have symptoms after completing the medication, continue to wear your mask if you develop a fever, make sure you remain home until you have been fever free for 24 hours with no medication. Go to the emergency department immediately if you experience shortness of breath, difficulty breathing, or other concerns. Please follow-up with your primary care physician to let them know of your COVID test result if it is positive. Follow-up as needed.

## 2023-06-26 NOTE — ED Triage Notes (Signed)
Pt c/o COVID Exposure, headache cough, nasal congestion, nausea x 3 days.

## 2023-06-27 ENCOUNTER — Telehealth (HOSPITAL_COMMUNITY): Payer: Self-pay | Admitting: Emergency Medicine

## 2023-06-27 LAB — SARS CORONAVIRUS 2 (TAT 6-24 HRS): SARS Coronavirus 2: POSITIVE — AB

## 2023-06-27 NOTE — Telephone Encounter (Signed)
Opened in error

## 2023-07-18 ENCOUNTER — Encounter: Payer: Self-pay | Admitting: Internal Medicine

## 2023-07-18 ENCOUNTER — Ambulatory Visit (INDEPENDENT_AMBULATORY_CARE_PROVIDER_SITE_OTHER): Payer: PPO | Admitting: Internal Medicine

## 2023-07-18 VITALS — BP 136/78 | HR 71 | Ht 65.0 in | Wt 175.6 lb

## 2023-07-18 DIAGNOSIS — I10 Essential (primary) hypertension: Secondary | ICD-10-CM

## 2023-07-18 DIAGNOSIS — Z1329 Encounter for screening for other suspected endocrine disorder: Secondary | ICD-10-CM

## 2023-07-18 DIAGNOSIS — Z1321 Encounter for screening for nutritional disorder: Secondary | ICD-10-CM | POA: Diagnosis not present

## 2023-07-18 DIAGNOSIS — R7303 Prediabetes: Secondary | ICD-10-CM | POA: Diagnosis not present

## 2023-07-18 DIAGNOSIS — E782 Mixed hyperlipidemia: Secondary | ICD-10-CM

## 2023-07-18 DIAGNOSIS — Z2821 Immunization not carried out because of patient refusal: Secondary | ICD-10-CM | POA: Diagnosis not present

## 2023-07-18 DIAGNOSIS — Z8719 Personal history of other diseases of the digestive system: Secondary | ICD-10-CM

## 2023-07-18 NOTE — Progress Notes (Signed)
Established Patient Office Visit  Subjective   Patient ID: Misty Wilson, female    DOB: 1941-06-28  Age: 82 y.o. MRN: [id number]  Chief Complaint  Patient presents with   knee follow up     Follow up from previous knee injury    [name] returns to care today for routine follow-up.  She was last evaluated by me on 6/18 for follow-up of posterior left knee pain.  At that time Protonix was started as she endorsed recent melena after taking ibuprofen.  In the interim, she presented to the emergency department on 8/26 endorsing upper respiratory symptoms.  She subsequently tested positive for COVID-19.  There have otherwise been no acute interval events.  [name] reports feeling well today.  She is asymptomatic and has no acute concerns to discuss.  There has been no recurrence of pain in her left knee.  Past Medical History:  Diagnosis Date   Breast mass, left 04/25/2013   Multiple gastric ulcers    Past Surgical History:  Procedure Laterality Date   AUGMENTATION MAMMAPLASTY     CATARACT EXTRACTION W/PHACO Right 11/23/2020   Procedure: CATARACT EXTRACTION PHACO AND INTRAOCULAR LENS PLACEMENT RIGHT EYE;  Surgeon: Fabio Pierce, MD;  Location: [facility];  Service: Ophthalmology;  Laterality: Right;  right CDE=28.15   CATARACT EXTRACTION W/PHACO Left 12/14/2020   Procedure: CATARACT EXTRACTION PHACO AND INTRAOCULAR LENS PLACEMENT (IOC);  Surgeon: Fabio Pierce, MD;  Location: [facility];  Service: Ophthalmology;  Laterality: Left;  CDE: 20.82   CHOLECYSTECTOMY     COLONOSCOPY N/A 04/06/2015   Procedure: COLONOSCOPY;  Surgeon: Corbin Ade, MD;  [facility]: [facility];  Service: Endoscopy;  Laterality: N/A;  8:45   COLONOSCOPY N/A 09/02/2020   Procedure: COLONOSCOPY;  Surgeon: Corbin Ade, MD;  [facility]: [facility];  Service: Endoscopy;  Laterality: N/A;  1:00pm   EXCISION MORTON'S NEUROMA Left 12/09/2015   Procedure: EXCISION NEUROMA LEFT FOOT;  Surgeon: Ferman Hamming, DPM;   Location: [facility];  Service: Podiatry;  Laterality: Left;   POLYPECTOMY  09/02/2020   Procedure: POLYPECTOMY;  Surgeon: Corbin Ade, MD;  Location: [facility];  Service: Endoscopy;;   TUBAL LIGATION     veins     had varicose veins done   Social History   Tobacco Use   Smoking status: Light Smoker    Current packs/day: 0.10    Average packs/day: 0.1 packs/day for 12.0 years (1.2 ttl pk-yrs)    Types: Cigarettes   Smokeless tobacco: Never   Tobacco comments:    smokes 1 pack per week  Vaping Use   Vaping status: Never Used  Substance Use Topics   Alcohol use: No   Drug use: No   Family History  Problem Relation Age of Onset   Cancer Mother        colon   Other Mother        aneursym   Cancer Brother        colon   Cancer Sister        bone,breast   Suicidality Daughter    Allergies  Allergen Reactions   Codeine Nausea And Vomiting   Other Nausea And Vomiting    Can not take Mycin drugs cause terrible stomach spasms   Prednisone Hypertension   Review of Systems  Constitutional:  Negative for chills and fever.  HENT:  Negative for sore throat.   Respiratory:  Negative for cough and shortness of breath.   Cardiovascular:  Negative for chest pain, palpitations and leg swelling.  Gastrointestinal:  Negative for abdominal pain, blood in stool, constipation, diarrhea, nausea and vomiting.  Genitourinary:  Negative for dysuria and hematuria.  Musculoskeletal:  Negative for myalgias.  Skin:  Negative for itching and rash.  Neurological:  Negative for dizziness and headaches.  Psychiatric/Behavioral:  Negative for depression and suicidal ideas.      Objective:     BP 136/78   Pulse 71   Ht 5\' 5"  (1.651 m)   Wt 175 lb 9.6 oz (79.7 kg)   SpO2 97%   BMI 29.22 kg/m  BP Readings from Last 3 Encounters:  07/18/23 136/78  06/26/23 125/75  05/02/23 133/83   Physical Exam Vitals reviewed.  Constitutional:      General: She is not in acute distress.     Appearance: Normal appearance. She is not toxic-appearing.  HENT:     Head: Normocephalic and atraumatic.     Right Ear: External ear normal.     Left Ear: External ear normal.     Nose: Nose normal. No congestion or rhinorrhea.     Mouth/Throat:     Mouth: Mucous membranes are moist.     Pharynx: Oropharynx is clear. No oropharyngeal exudate or posterior oropharyngeal erythema.  Eyes:     General: No scleral icterus.    Extraocular Movements: Extraocular movements intact.     Conjunctiva/sclera: Conjunctivae normal.     Pupils: Pupils are equal, round, and reactive to light.  Cardiovascular:     Rate and Rhythm: Normal rate and regular rhythm.     Pulses: Normal pulses.     Heart sounds: Normal heart sounds. No murmur heard.    No friction rub. No gallop.  Pulmonary:     Effort: Pulmonary effort is normal.     Breath sounds: Normal breath sounds. No wheezing, rhonchi or rales.  Abdominal:     General: Abdomen is flat. Bowel sounds are normal. There is no distension.     Palpations: Abdomen is soft.     Tenderness: There is no abdominal tenderness.  Musculoskeletal:        General: No swelling. Normal range of motion.     Cervical back: Normal range of motion.     Right lower leg: No edema.     Left lower leg: No edema.  Lymphadenopathy:     Cervical: No cervical adenopathy.  Skin:    General: Skin is warm and dry.     Capillary Refill: Capillary refill takes less than 2 seconds.     Coloration: Skin is not jaundiced.  Neurological:     General: No focal deficit present.     Mental Status: She is alert and oriented to person, place, and time.  Psychiatric:        Mood and Affect: Mood normal.        Behavior: Behavior normal.   Last CBC Lab Results  Component Value Date   WBC 8.8 04/18/2023   HGB 12.7 04/18/2023   HCT 37.8 04/18/2023   MCV 90 04/18/2023   MCH 30.1 04/18/2023   RDW 13.0 04/18/2023   PLT 270 04/18/2023   Last metabolic panel Lab Results   Component Value Date   GLUCOSE 104 (H) 07/13/2022   NA 140 07/13/2022   K 4.6 07/13/2022   CL 105 07/13/2022   CO2 21 07/13/2022   BUN 16 07/13/2022   CREATININE 0.65 07/13/2022   EGFR 89 07/13/2022   CALCIUM 9.3 07/13/2022  PROT 6.4 07/13/2022   ALBUMIN 4.3 07/13/2022   LABGLOB 2.1 07/13/2022   AGRATIO 2.0 07/13/2022   BILITOT 0.3 07/13/2022   ALKPHOS 73 07/13/2022   AST 16 07/13/2022   ALT 15 07/13/2022   ANIONGAP 7 09/03/2018   Last lipids Lab Results  Component Value Date   CHOL 162 07/13/2022   HDL 33 (L) 07/13/2022   LDLCALC 111 (H) 07/13/2022   TRIG 98 07/13/2022   CHOLHDL 4.9 (H) 07/13/2022   Last hemoglobin A1c Lab Results  Component Value Date   HGBA1C 6.0 (H) 07/13/2022   Last thyroid functions Lab Results  Component Value Date   TSH 2.470 07/13/2022   Last vitamin D Lab Results  Component Value Date   VD25OH 30.2 07/13/2022     Assessment & Plan:   Problem List Items Addressed This Visit       Hypertension    Previously documented history of hypertension.  She has been prescribed HCTZ 12.5 mg daily but reports that she is not taking medication currently.  BP is acceptable off antihypertensive medication.  No changes are indicated today.      History of melena    Denies melena since June.  Protonix was previously prescribed but she is no longer taking it.      Prediabetes    A1c 6.0 on labs from September 2023.  Repeat A1c ordered today.      Return in about 6 months (around 01/15/2024).   Billie Lade, MD

## 2023-07-18 NOTE — Assessment & Plan Note (Signed)
A1c 6.0 on labs from September 2023.  Repeat A1c ordered today.

## 2023-07-18 NOTE — Assessment & Plan Note (Signed)
Previously documented history of hypertension.  She has been prescribed HCTZ 12.5 mg daily but reports that she is not taking medication currently.  BP is acceptable off antihypertensive medication.  No changes are indicated today.

## 2023-07-18 NOTE — Patient Instructions (Signed)
It was a pleasure to see you today.  Thank you for giving [place] the opportunity to be involved in your care.  Below is a brief recap of your visit and next steps.  We will plan to see you again in 6 months.  Summary No medications changes today Repeat labs ordered Follow up in 6 months

## 2023-07-18 NOTE — Assessment & Plan Note (Signed)
Denies melena since June.  Protonix was previously prescribed but she is no longer taking it.

## 2023-09-26 ENCOUNTER — Ambulatory Visit
Admission: EM | Admit: 2023-09-26 | Discharge: 2023-09-26 | Disposition: A | Discharge status: Home / Self Care | Payer: PPO | Attending: Family Medicine | Admitting: Family Medicine

## 2023-09-26 DIAGNOSIS — R21 Rash and other nonspecific skin eruption: Secondary | ICD-10-CM

## 2023-09-26 MED ORDER — TRIAMCINOLONE ACETONIDE 0.1 % EX CREA: 1.0000 | TOPICAL_CREAM | Freq: Two times a day (BID) | CUTANEOUS | 0 refills | Status: DC

## 2023-09-26 MED ORDER — VALACYCLOVIR HCL 1 G PO TABS: 1000.0000 mg | ORAL_TABLET | Freq: Three times a day (TID) | ORAL | 0 refills | Status: AC

## 2023-09-26 NOTE — ED Triage Notes (Signed)
Pt reports a rash in between her breasts that started yesterday and is causing pain.

## 2023-09-26 NOTE — ED Provider Notes (Signed)
[facility]    CSN: [id number] Arrival date & time: 09/26/23  8295      History   Chief Complaint No chief complaint on file.   HPI Misty Wilson is a 82 y.o. female.   Patient presenting today with 1 day history of painful burning rash between her breasts.  She states she has had shingles twice and this feels very consistent with when she had shingles.  Denies any new products, medications, foods, or any other new exposures.  So far not try anything over-the-counter for symptoms.    Past Medical History:  Diagnosis Date   Breast mass, left 04/25/2013   Multiple gastric ulcers     Patient Active Problem List   Diagnosis Date Noted   Prediabetes 07/18/2023   Vulvar itching 05/02/2023   History of melena 04/18/2023   Posterior knee pain, left 04/04/2023   Encounter for screening fecal occult blood testing 08/02/2022   Routine Papanicolaou smear 08/02/2022   ASCUS of cervix with negative high risk HPV 08/02/2022   Encounter for gynecological examination with Papanicolaou smear of cervix 08/02/2022   Hypertension 08/02/2022   Screening mammogram for breast cancer 08/02/2022   Pessary maintenance 03/19/2021   Ankle swelling 03/19/2021   FH: colon cancer 07/21/2020   Cystocele with uterine prolapse 04/07/2020   Screening for colorectal cancer 04/07/2020   Encounter for fitting and adjustment of pessary 04/07/2020   Elevated cholesterol with elevated triglycerides 09/18/2018   Elevated BP without diagnosis of hypertension 09/18/2018   History of colonic polyps    Diverticulosis of colon without hemorrhage    Breast mass, left 04/25/2013   TIBIALIS TENDINITIS 04/27/2009   FOOT PAIN, RIGHT 11/20/2008    Past Surgical History:  Procedure Laterality Date   AUGMENTATION MAMMAPLASTY     CATARACT EXTRACTION W/PHACO Right 11/23/2020   Procedure: CATARACT EXTRACTION PHACO AND INTRAOCULAR LENS PLACEMENT RIGHT EYE;  Surgeon: Fabio Pierce, MD;  Location: [facility];  Service: Ophthalmology;  Laterality: Right;  right CDE=28.15   CATARACT EXTRACTION W/PHACO Left 12/14/2020   Procedure: CATARACT EXTRACTION PHACO AND INTRAOCULAR LENS PLACEMENT (IOC);  Surgeon: Fabio Pierce, MD;  Location: [facility];  Service: Ophthalmology;  Laterality: Left;  CDE: 20.82   CHOLECYSTECTOMY     COLONOSCOPY N/A 04/06/2015   Procedure: COLONOSCOPY;  Surgeon: Corbin Ade, MD;  Location: [facility];  Service: Endoscopy;  Laterality: N/A;  8:45   COLONOSCOPY N/A 09/02/2020   Procedure: COLONOSCOPY;  Surgeon: Corbin Ade, MD;  Location: [facility];  Service: Endoscopy;  Laterality: N/A;  1:00pm   EXCISION MORTON'S NEUROMA Left 12/09/2015   Procedure: EXCISION NEUROMA LEFT FOOT;  Surgeon: Ferman Hamming, DPM;  Location: [facility];  Service: Podiatry;  Laterality: Left;   POLYPECTOMY  09/02/2020   Procedure: POLYPECTOMY;  Surgeon: Corbin Ade, MD;  Location: [facility];  Service: Endoscopy;;   TUBAL LIGATION     veins     had varicose veins done    OB History     Gravida  4   Para  4   Term      Preterm      AB      Living         SAB      IAB      Ectopic      Multiple      Live Births               Home Medications  Prior to Admission medications   Medication Sig Start Date End Date Taking? Authorizing Provider  triamcinolone cream (KENALOG) 0.1 % Apply 1 Application topically 2 (two) times daily. 09/26/23  Yes Particia Nearing, PA-C  valACYclovir (VALTREX) 1000 MG tablet Take 1 tablet (1,000 mg total) by mouth 3 (three) times daily for 7 days. 09/26/23 10/03/23 Yes Particia Nearing, PA-C  Ascorbic Acid (VITAMIN C) 1000 MG tablet Take 2,000 mg by mouth daily.    [provider]  aspirin EC 81 MG tablet Take 81 mg by mouth every evening. Swallow whole.    [provider]  Calcium-Magnesium-Vitamin D (CALCIUM 1200+D3 PO) Take 1 tablet by mouth daily.    [provider]  cholecalciferol  (VITAMIN D) 25 MCG (1000 UNIT) tablet Take 1,000 Units by mouth daily.    [provider]  clotrimazole-betamethasone (LOTRISONE) cream Apply thin layer to external tissue bid prn 05/02/23   [name], NP  Ginkgo Biloba 120 MG CAPS Take 120 mg by mouth daily.    [provider]  guaiFENesin (ROBITUSSIN) 100 MG/5ML liquid Take 10 mLs by mouth every 8 (eight) hours as needed for cough or to loosen phlegm. 06/26/23   Leath-Warren, [name], NP  ipratropium (ATROVENT) 0.03 % nasal spray Place 2 sprays into both nostrils every 12 (twelve) hours. 06/26/23   Leath-Warren, [name], NP  Krill Oil 350 MG CAPS Take 350 mg by mouth daily.    [provider]  Turmeric 500 MG CAPS Take 5,000 mg by mouth daily.    [provider]  vitamin B-12 (CYANOCOBALAMIN) 1000 MCG tablet Take 1,000 mcg by mouth daily.    [provider]    Family History Family History  Problem Relation Age of Onset   Cancer Mother        colon   Other Mother        aneursym   Cancer Brother        colon   Cancer Sister        bone,breast   Suicidality Daughter     Social History Social History   Tobacco Use   Smoking status: Light Smoker    Current packs/day: 0.10    Average packs/day: 0.1 packs/day for 12.0 years (1.2 ttl pk-yrs)    Types: Cigarettes   Smokeless tobacco: Never   Tobacco comments:    smokes 1 pack per week  Vaping Use   Vaping status: Never Used  Substance Use Topics   Alcohol use: No   Drug use: No     Allergies   Codeine, Other, and Prednisone   Review of Systems Review of Systems Per HPI  Physical Exam Triage Vital Signs ED Triage Vitals  Encounter Vitals Group     BP 09/26/23 0848 133/80     Systolic BP Percentile --      Diastolic BP Percentile --      Pulse Rate 09/26/23 0848 74     Resp 09/26/23 0848 18     Temp 09/26/23 0848 98.3 F (36.8 C)     Temp Source 09/26/23 0848 Oral     SpO2 09/26/23 0848 96 %     Weight  --      Height --      Head Circumference --      Peak Flow --      Pain Score 09/26/23 0849 2     Pain Loc --      Pain Education --  Exclude from Growth Chart --    No data found.  Updated Vital Signs BP 133/80 (BP Location: Right Arm)   Pulse 74   Temp 98.3 F (36.8 C) (Oral)   Resp 18   SpO2 96%   Visual Acuity Right Eye Distance:   Left Eye Distance:   Bilateral Distance:    Right Eye Near:   Left Eye Near:    Bilateral Near:     Physical Exam Vitals and nursing note reviewed.  Constitutional:      Appearance: Normal appearance. She is not ill-appearing.  HENT:     Head: Atraumatic.  Eyes:     Extraocular Movements: Extraocular movements intact.     Conjunctiva/sclera: Conjunctivae normal.  Cardiovascular:     Rate and Rhythm: Normal rate and regular rhythm.     Heart sounds: Normal heart sounds.  Pulmonary:     Effort: Pulmonary effort is normal.     Breath sounds: Normal breath sounds.  Musculoskeletal:        General: Normal range of motion.     Cervical back: Normal range of motion and neck supple.  Skin:    General: Skin is warm and dry.     Findings: Rash present.     Comments: Several erythematous papules and pustules to the area just between breasts worse on the right but does cross midline slightly  Neurological:     Mental Status: She is alert and oriented to person, place, and time.  Psychiatric:        Mood and Affect: Mood normal.        Thought Content: Thought content normal.        Judgment: Judgment normal.      UC Treatments / Results  Labs (all labs ordered are listed, but only abnormal results are displayed) Labs Reviewed - No data to display  EKG   Radiology No results found.  Procedures Procedures (including critical care time)  Medications Ordered in UC Medications - No data to display  Initial Impression / Assessment and Plan / UC Course  I have reviewed the triage vital signs and the nursing  notes.  Pertinent labs & imaging results that were available during my care of the patient were reviewed by me and considered in my medical decision making (see chart for details).     On exam not completely consistent with typical shingles, however given the burning sensation in her history of shingles will cover with Valtrex while also treating with triamcinolone topically.  Discussed avoiding irritants and follow-up for worsening symptoms.  Final Clinical Impressions(s) / UC Diagnoses   Final diagnoses:  Rash and nonspecific skin eruption   Discharge Instructions   None    ED Prescriptions     Medication Sig Dispense Auth. Provider   triamcinolone cream (KENALOG) 0.1 % Apply 1 Application topically 2 (two) times daily. 30 g Particia Nearing, [place]   valACYclovir (VALTREX) 1000 MG tablet Take 1 tablet (1,000 mg total) by mouth 3 (three) times daily for 7 days. 21 tablet Particia Nearing, [place]      PDMP not reviewed this encounter.   Particia Nearing, [place] 09/26/23 0930

## 2023-10-09 ENCOUNTER — Ambulatory Visit
Admission: EM | Admit: 2023-10-09 | Discharge: 2023-10-09 | Disposition: A | Discharge status: Home / Self Care | Payer: PPO | Attending: Nurse Practitioner | Admitting: Nurse Practitioner

## 2023-10-09 DIAGNOSIS — R052 Subacute cough: Secondary | ICD-10-CM | POA: Diagnosis not present

## 2023-10-09 MED ORDER — BENZONATATE 100 MG PO CAPS
100.0000 mg | ORAL_CAPSULE | Freq: Three times a day (TID) | ORAL | 0 refills | Status: DC | PRN
Start: 2023-10-09 — End: 2024-06-06

## 2023-10-09 MED ORDER — FLUTICASONE PROPIONATE 50 MCG/ACT NA SUSP
1.0000 | Freq: Every day | NASAL | 0 refills | Status: DC
Start: 2023-10-09 — End: 2024-06-06

## 2023-10-09 NOTE — ED Triage Notes (Signed)
Pt reports a cough, x 1 week, has tried OTC meds has found no relief.

## 2023-10-09 NOTE — Discharge Instructions (Signed)
Take the tessalon perles every 8 hours as needed for cough.  You can also use the flonase nasal spray to help with throat drainage.  Seek care if symptoms do not improve with treatment.

## 2023-10-09 NOTE — ED Provider Notes (Signed)
[facility]    CSN: [id number] Arrival date & time: 10/09/23  1133      History   Chief Complaint No chief complaint on file.   HPI Misty Wilson is a 82 y.o. female.   Patient presents today for 8 to 9-day history of dry cough with some chest tightness and headache from coughing so much.  Also endorses fatigue.  No fever, bodyaches or chills, shortness of breath or chest pain, sore throat, ear pain, abdominal pain, nausea/vomiting, or diarrhea.  Reports she also had nasal congestion and runny nose, but the symptoms are now improved.  Recently returned from traveling to [place] with family members.  Has been taking Delsym for the cough which makes her feel terribly and is not really helping.    Past Medical History:  Diagnosis Date   Breast mass, left 04/25/2013   Multiple gastric ulcers     Patient Active Problem List   Diagnosis Date Noted   Prediabetes 07/18/2023   Vulvar itching 05/02/2023   History of melena 04/18/2023   Posterior knee pain, left 04/04/2023   Encounter for screening fecal occult blood testing 08/02/2022   Routine Papanicolaou smear 08/02/2022   ASCUS of cervix with negative high risk HPV 08/02/2022   Encounter for gynecological examination with Papanicolaou smear of cervix 08/02/2022   Hypertension 08/02/2022   Screening mammogram for breast cancer 08/02/2022   Pessary maintenance 03/19/2021   Ankle swelling 03/19/2021   FH: colon cancer 07/21/2020   Cystocele with uterine prolapse 04/07/2020   Screening for colorectal cancer 04/07/2020   Encounter for fitting and adjustment of pessary 04/07/2020   Elevated cholesterol with elevated triglycerides 09/18/2018   Elevated BP without diagnosis of hypertension 09/18/2018   History of colonic polyps    Diverticulosis of colon without hemorrhage    Breast mass, left 04/25/2013   TIBIALIS TENDINITIS 04/27/2009   FOOT PAIN, RIGHT 11/20/2008    Past Surgical History:  Procedure  Laterality Date   AUGMENTATION MAMMAPLASTY     CATARACT EXTRACTION W/PHACO Right 11/23/2020   Procedure: CATARACT EXTRACTION PHACO AND INTRAOCULAR LENS PLACEMENT RIGHT EYE;  Surgeon: Fabio Pierce, MD;  Location: [facility];  Service: Ophthalmology;  Laterality: Right;  right CDE=28.15   CATARACT EXTRACTION W/PHACO Left 12/14/2020   Procedure: CATARACT EXTRACTION PHACO AND INTRAOCULAR LENS PLACEMENT (IOC);  Surgeon: Fabio Pierce, MD;  Location: [facility];  Service: Ophthalmology;  Laterality: Left;  CDE: 20.82   CHOLECYSTECTOMY     COLONOSCOPY N/A 04/06/2015   Procedure: COLONOSCOPY;  Surgeon: Corbin Ade, MD;  Location: [facility];  Service: Endoscopy;  Laterality: N/A;  8:45   COLONOSCOPY N/A 09/02/2020   Procedure: COLONOSCOPY;  Surgeon: Corbin Ade, MD;  Location: [facility];  Service: Endoscopy;  Laterality: N/A;  1:00pm   EXCISION MORTON'S NEUROMA Left 12/09/2015   Procedure: EXCISION NEUROMA LEFT FOOT;  Surgeon: Ferman Hamming, DPM;  Location: [facility];  Service: Podiatry;  Laterality: Left;   POLYPECTOMY  09/02/2020   Procedure: POLYPECTOMY;  Surgeon: Corbin Ade, MD;  Location: [facility];  Service: Endoscopy;;   TUBAL LIGATION     veins     had varicose veins done    OB History     Gravida  4   Para  4   Term      Preterm      AB      Living         SAB  IAB      Ectopic      Multiple      Live Births               Home Medications    Prior to Admission medications   Medication Sig Start Date End Date Taking? Authorizing Provider  benzonatate (TESSALON) 100 MG capsule Take 1 capsule (100 mg total) by mouth 3 (three) times daily as needed for cough. 10/09/23  Yes Valentino Nose, NP  fluticasone (FLONASE) 50 MCG/ACT nasal spray Place 1 spray into both nostrils daily. 10/09/23  Yes Valentino Nose, NP  Ascorbic Acid (VITAMIN C) 1000 MG tablet Take 2,000 mg by mouth daily.    [provider]  aspirin EC 81 MG tablet Take  81 mg by mouth every evening. Swallow whole.    [provider]  Calcium-Magnesium-Vitamin D (CALCIUM 1200+D3 PO) Take 1 tablet by mouth daily.    [provider]  cholecalciferol (VITAMIN D) 25 MCG (1000 UNIT) tablet Take 1,000 Units by mouth daily.    [provider]  clotrimazole-betamethasone (LOTRISONE) cream Apply thin layer to external tissue bid prn 05/02/23   [name], NP  Ginkgo Biloba 120 MG CAPS Take 120 mg by mouth daily.    [provider]  guaiFENesin (ROBITUSSIN) 100 MG/5ML liquid Take 10 mLs by mouth every 8 (eight) hours as needed for cough or to loosen phlegm. 06/26/23   Leath-Warren, [name], NP  ipratropium (ATROVENT) 0.03 % nasal spray Place 2 sprays into both nostrils every 12 (twelve) hours. 06/26/23   Leath-Warren, [name], NP  Krill Oil 350 MG CAPS Take 350 mg by mouth daily.    [provider]  triamcinolone cream (KENALOG) 0.1 % Apply 1 Application topically 2 (two) times daily. 09/26/23   Particia Nearing, PA-C  Turmeric 500 MG CAPS Take 5,000 mg by mouth daily.    [provider]  vitamin B-12 (CYANOCOBALAMIN) 1000 MCG tablet Take 1,000 mcg by mouth daily.    [provider]    Family History Family History  Problem Relation Age of Onset   Cancer Mother        colon   Other Mother        aneursym   Cancer Brother        colon   Cancer Sister        bone,breast   Suicidality Daughter     Social History Social History   Tobacco Use   Smoking status: Light Smoker    Current packs/day: 0.10    Average packs/day: 0.1 packs/day for 12.0 years (1.2 ttl pk-yrs)    Types: Cigarettes   Smokeless tobacco: Never   Tobacco comments:    smokes 1 pack per week  Vaping Use   Vaping status: Never Used  Substance Use Topics   Alcohol use: No   Drug use: No     Allergies   Codeine, Other, and Prednisone   Review of Systems Review of Systems Per HPI  Physical Exam Triage  Vital Signs ED Triage Vitals  Encounter Vitals Group     BP 10/09/23 1303 (!) 142/84     Systolic BP Percentile --      Diastolic BP Percentile --      Pulse Rate 10/09/23 1303 92     Resp 10/09/23 1303 18     Temp 10/09/23 1303 98.1 F (36.7 C)     Temp Source 10/09/23 1303 Oral  SpO2 10/09/23 1303 94 %     Weight --      Height --      Head Circumference --      Peak Flow --      Pain Score 10/09/23 1305 0     Pain Loc --      Pain Education --      Exclude from Growth Chart --    No data found.  Updated Vital Signs BP (!) 142/84 (BP Location: Right Arm)   Pulse 92   Temp 98.1 F (36.7 C) (Oral)   Resp 18   SpO2 94%   Visual Acuity Right Eye Distance:   Left Eye Distance:   Bilateral Distance:    Right Eye Near:   Left Eye Near:    Bilateral Near:     Physical Exam Vitals and nursing note reviewed.  Constitutional:      General: She is not in acute distress.    Appearance: Normal appearance. She is not ill-appearing or toxic-appearing.  HENT:     Head: Normocephalic and atraumatic.     Right Ear: Tympanic membrane, ear canal and external ear normal.     Left Ear: Tympanic membrane, ear canal and external ear normal.     Nose: Congestion present. No rhinorrhea.     Mouth/Throat:     Mouth: Mucous membranes are moist.     Pharynx: Oropharynx is clear. No oropharyngeal exudate or posterior oropharyngeal erythema.  Eyes:     General: No scleral icterus.    Extraocular Movements: Extraocular movements intact.  Cardiovascular:     Rate and Rhythm: Normal rate and regular rhythm.  Pulmonary:     Effort: Pulmonary effort is normal. No respiratory distress.     Breath sounds: Normal breath sounds. No wheezing, rhonchi or rales.  Abdominal:     Palpations: Abdomen is soft.  Musculoskeletal:     Cervical back: Normal range of motion and neck supple.  Lymphadenopathy:     Cervical: No cervical adenopathy.  Skin:    General: Skin is warm and dry.      Coloration: Skin is not jaundiced or pale.     Findings: No erythema or rash.  Neurological:     Mental Status: She is alert and oriented to person, place, and time.  Psychiatric:        Behavior: Behavior is cooperative.      UC Treatments / Results  Labs (all labs ordered are listed, but only abnormal results are displayed) Labs Reviewed - No data to display  EKG   Radiology No results found.  Procedures Procedures (including critical care time)  Medications Ordered in UC Medications - No data to display  Initial Impression / Assessment and Plan / UC Course  I have reviewed the triage vital signs and the nursing notes.  Pertinent labs & imaging results that were available during my care of the patient were reviewed by me and considered in my medical decision making (see chart for details).   Patient is well-appearing, normotensive, afebrile, not tachycardic, not tachypneic, oxygenating well on room air.    1. Subacute cough Overall, vitals and exam are reassuring Start cough suppressant medication, Flonase nasal spray Chest x-ray deferred today given clear lung sounds, stable vital signs and using shared patient decision making Return and ER precautions discussed  The patient was given the opportunity to ask questions.  All questions answered to their satisfaction.  The patient is in agreement to this plan.  Final Clinical Impressions(s) / UC Diagnoses   Final diagnoses:  Subacute cough     Discharge Instructions      Take the tessalon perles every 8 hours as needed for cough.  You can also use the flonase nasal spray to help with throat drainage.  Seek care if symptoms do not improve with treatment.     ED Prescriptions     Medication Sig Dispense Auth. Provider   fluticasone (FLONASE) 50 MCG/ACT nasal spray Place 1 spray into both nostrils daily. 16 g Cathlean Marseilles A, NP   benzonatate (TESSALON) 100 MG capsule Take 1 capsule (100 mg total) by  mouth 3 (three) times daily as needed for cough. 21 capsule Valentino Nose, NP      PDMP not reviewed this encounter.   Valentino Nose, NP 10/09/23 1324

## 2023-10-10 ENCOUNTER — Ambulatory Visit: Payer: Self-pay

## 2023-11-21 DIAGNOSIS — G5762 Lesion of plantar nerve, left lower limb: Secondary | ICD-10-CM | POA: Diagnosis not present

## 2023-11-21 DIAGNOSIS — M79672 Pain in left foot: Secondary | ICD-10-CM | POA: Diagnosis not present

## 2023-11-21 DIAGNOSIS — M79674 Pain in right toe(s): Secondary | ICD-10-CM | POA: Diagnosis not present

## 2023-11-21 DIAGNOSIS — L600 Ingrowing nail: Secondary | ICD-10-CM | POA: Diagnosis not present

## 2023-11-22 DIAGNOSIS — L570 Actinic keratosis: Secondary | ICD-10-CM | POA: Diagnosis not present

## 2024-01-16 ENCOUNTER — Ambulatory Visit: Payer: PPO | Admitting: Internal Medicine

## 2024-02-08 ENCOUNTER — Encounter: Payer: Self-pay | Admitting: Internal Medicine

## 2024-02-08 ENCOUNTER — Ambulatory Visit (INDEPENDENT_AMBULATORY_CARE_PROVIDER_SITE_OTHER): Payer: Self-pay | Admitting: Internal Medicine

## 2024-02-08 VITALS — BP 136/70 | HR 73 | Ht 65.0 in | Wt 173.8 lb

## 2024-02-08 DIAGNOSIS — R7303 Prediabetes: Secondary | ICD-10-CM | POA: Diagnosis not present

## 2024-02-08 DIAGNOSIS — E782 Mixed hyperlipidemia: Secondary | ICD-10-CM | POA: Diagnosis not present

## 2024-02-08 DIAGNOSIS — I10 Essential (primary) hypertension: Secondary | ICD-10-CM

## 2024-02-08 NOTE — Assessment & Plan Note (Signed)
 A1c 6.0 on labs from September 2024.  She remains focused on dietary changes aimed at lowering her blood sugar.

## 2024-02-08 NOTE — Progress Notes (Signed)
 Established Patient Office Visit  Subjective   Patient ID: Misty Wilson, female    DOB: 1941-01-16  Age: 83 y.o. MRN: [id number]  Chief Complaint  Patient presents with   Care Management    Follow up    Misty Wilson returns to care today for routine follow-up.  She was last evaluated by me in September 2024.  No medication changes were made at that time and 38-month follow-up was arranged.  In the interim, she has been seen by podiatry and dermatology for follow-up.  There have otherwise been no acute interval events.  Today she reports feeling well and has no acute concerns to discuss.  Past Medical History:  Diagnosis Date   Breast mass, left 04/25/2013   Multiple gastric ulcers    Past Surgical History:  Procedure Laterality Date   AUGMENTATION MAMMAPLASTY     CATARACT EXTRACTION W/PHACO Right 11/23/2020   Procedure: CATARACT EXTRACTION PHACO AND INTRAOCULAR LENS PLACEMENT RIGHT EYE;  Surgeon: Fabio Pierce, MD;  Location: [facility];  Service: Ophthalmology;  Laterality: Right;  right CDE=28.15   CATARACT EXTRACTION W/PHACO Left 12/14/2020   Procedure: CATARACT EXTRACTION PHACO AND INTRAOCULAR LENS PLACEMENT (IOC);  Surgeon: Fabio Pierce, MD;  Location: [facility];  Service: Ophthalmology;  Laterality: Left;  CDE: 20.82   CHOLECYSTECTOMY     COLONOSCOPY N/A 04/06/2015   Procedure: COLONOSCOPY;  Surgeon: Corbin Ade, MD;  Location: [facility];  Service: Endoscopy;  Laterality: N/A;  8:45   COLONOSCOPY N/A 09/02/2020   Procedure: COLONOSCOPY;  Surgeon: Corbin Ade, MD;  Location: [facility];  Service: Endoscopy;  Laterality: N/A;  1:00pm   EXCISION MORTON'S NEUROMA Left 12/09/2015   Procedure: EXCISION NEUROMA LEFT FOOT;  Surgeon: Ferman Hamming, DPM;  Location: [facility];  Service: Podiatry;  Laterality: Left;   POLYPECTOMY  09/02/2020   Procedure: POLYPECTOMY;  Surgeon: Corbin Ade, MD;  Location: [facility];  Service: Endoscopy;;   TUBAL LIGATION     veins     had  varicose veins done   Social History   Tobacco Use   Smoking status: Light Smoker    Current packs/day: 0.10    Average packs/day: 0.1 packs/day for 12.0 years (1.2 ttl pk-yrs)    Types: Cigarettes   Smokeless tobacco: Never   Tobacco comments:    smokes 1 pack per week  Vaping Use   Vaping status: Never Used  Substance Use Topics   Alcohol use: No   Drug use: No   Family History  Problem Relation Age of Onset   Cancer Mother        colon   Other Mother        aneursym   Cancer Brother        colon   Cancer Sister        bone,breast   Suicidality Daughter    Allergies  Allergen Reactions   Codeine Nausea And Vomiting   Other Nausea And Vomiting    Can not take Mycin drugs cause terrible stomach spasms   Prednisone Hypertension   Review of Systems  Constitutional:  Negative for chills and fever.  HENT:  Negative for sore throat.   Respiratory:  Negative for cough and shortness of breath.   Cardiovascular:  Negative for chest pain, palpitations and leg swelling.  Gastrointestinal:  Negative for abdominal pain, blood in stool, constipation, diarrhea, nausea and vomiting.  Genitourinary:  Negative for dysuria and hematuria.  Musculoskeletal:  Negative for myalgias.  Skin:  Negative for itching and rash.  Neurological:  Negative for dizziness and headaches.  Psychiatric/Behavioral:  Negative for depression and suicidal ideas.      Objective:     BP 136/70   Pulse 73   Ht 5\' 5"  (1.651 m)   Wt 173 lb 12.8 oz (78.8 kg)   SpO2 96%   BMI 28.92 kg/m  BP Readings from Last 3 Encounters:  02/08/24 136/70  10/09/23 (!) 142/84  09/26/23 133/80   Physical Exam Vitals reviewed.  Constitutional:      General: She is not in acute distress.    Appearance: Normal appearance. She is not toxic-appearing.  HENT:     Head: Normocephalic and atraumatic.     Right Ear: External ear normal.     Left Ear: External ear normal.     Nose: Nose normal. No congestion or  rhinorrhea.     Mouth/Throat:     Mouth: Mucous membranes are moist.     Pharynx: Oropharynx is clear. No oropharyngeal exudate or posterior oropharyngeal erythema.  Eyes:     General: No scleral icterus.    Extraocular Movements: Extraocular movements intact.     Conjunctiva/sclera: Conjunctivae normal.     Pupils: Pupils are equal, round, and reactive to light.  Cardiovascular:     Rate and Rhythm: Normal rate and regular rhythm.     Pulses: Normal pulses.     Heart sounds: Normal heart sounds. No murmur heard.    No friction rub. No gallop.  Pulmonary:     Effort: Pulmonary effort is normal.     Breath sounds: Normal breath sounds. No wheezing, rhonchi or rales.  Abdominal:     General: Abdomen is flat. Bowel sounds are normal. There is no distension.     Palpations: Abdomen is soft.     Tenderness: There is no abdominal tenderness.  Musculoskeletal:        General: No swelling. Normal range of motion.     Cervical back: Normal range of motion.     Right lower leg: No edema.     Left lower leg: No edema.  Lymphadenopathy:     Cervical: No cervical adenopathy.  Skin:    General: Skin is warm and dry.     Capillary Refill: Capillary refill takes less than 2 seconds.     Coloration: Skin is not jaundiced.  Neurological:     General: No focal deficit present.     Mental Status: She is alert and oriented to person, place, and time.  Psychiatric:        Mood and Affect: Mood normal.        Behavior: Behavior normal.   Last CBC Lab Results  Component Value Date   WBC 8.0 07/18/2023   HGB 13.7 07/18/2023   HCT 42.3 07/18/2023   MCV 93 07/18/2023   MCH 30.0 07/18/2023   RDW 12.7 07/18/2023   PLT 294 07/18/2023   Last metabolic panel Lab Results  Component Value Date   GLUCOSE 88 07/18/2023   NA 138 07/18/2023   K 4.5 07/18/2023   CL 100 07/18/2023   CO2 21 07/18/2023   BUN 12 07/18/2023   CREATININE 0.72 07/18/2023   EGFR 84 07/18/2023   CALCIUM 9.8 07/18/2023    PROT 6.7 07/18/2023   ALBUMIN 4.4 07/18/2023   LABGLOB 2.3 07/18/2023   AGRATIO 2.0 07/13/2022   BILITOT 0.3 07/18/2023   ALKPHOS 101 07/18/2023   AST 16 07/18/2023   ALT 13  07/18/2023   ANIONGAP 7 09/03/2018   Last lipids Lab Results  Component Value Date   CHOL 216 (H) 07/18/2023   HDL 34 (L) 07/18/2023   LDLCALC 154 (H) 07/18/2023   TRIG 155 (H) 07/18/2023   CHOLHDL 6.4 (H) 07/18/2023   Last hemoglobin A1c Lab Results  Component Value Date   HGBA1C 6.0 (H) 07/18/2023   Last thyroid functions Lab Results  Component Value Date   TSH 2.790 07/18/2023   Last vitamin D Lab Results  Component Value Date   VD25OH 29.9 (L) 07/18/2023   Last vitamin B12 and Folate Lab Results  Component Value Date   VITAMINB12 >2000 (H) 07/18/2023   FOLATE 9.2 07/18/2023     Assessment & Plan:   Problem List Items Addressed This Visit       Hypertension - Primary   Remains adequately controlled off antihypertensive medication.  No changes are indicated today.  She will continue to monitor her blood pressure at home.      Prediabetes   A1c 6.0 on labs from September 2024.  She remains focused on dietary changes aimed at lowering her blood sugar.      Mixed hyperlipidemia   Lipid panel updated in September.  Total cholesterol 216 and LDL 154.  She remains focused on dietary changes aimed at lowering her cholesterol and plans to start following a Mediterranean diet.  Repeat lipid panel was follow-up in 6 months.      Return in about 6 months (around 08/09/2024).   Billie Lade, MD

## 2024-02-08 NOTE — Patient Instructions (Signed)
 It was a pleasure to see you today.  Thank you for giving [place] the opportunity to be involved in your care.  Below is a brief recap of your visit and next steps.  We will plan to see you again in 6 months.  Summary No medication changes today I recommend following a Mediterranean diet as we discussed Follow up in 6 months

## 2024-02-08 NOTE — Assessment & Plan Note (Signed)
 Lipid panel updated in September.  Total cholesterol 216 and LDL 154.  She remains focused on dietary changes aimed at lowering her cholesterol and plans to start following a Mediterranean diet.  Repeat lipid panel was follow-up in 6 months.

## 2024-02-08 NOTE — Assessment & Plan Note (Signed)
 Remains adequately controlled off antihypertensive medication.  No changes are indicated today.  She will continue to monitor her blood pressure at home.

## 2024-02-13 DIAGNOSIS — L608 Other nail disorders: Secondary | ICD-10-CM | POA: Diagnosis not present

## 2024-02-13 DIAGNOSIS — B351 Tinea unguium: Secondary | ICD-10-CM | POA: Diagnosis not present

## 2024-03-05 DIAGNOSIS — L603 Nail dystrophy: Secondary | ICD-10-CM | POA: Diagnosis not present

## 2024-03-06 ENCOUNTER — Ambulatory Visit: Admitting: Adult Health

## 2024-03-06 ENCOUNTER — Encounter: Payer: Self-pay | Admitting: Adult Health

## 2024-03-06 VITALS — BP 182/71 | HR 56 | Ht 65.0 in | Wt 174.0 lb

## 2024-03-06 DIAGNOSIS — Z1231 Encounter for screening mammogram for malignant neoplasm of breast: Secondary | ICD-10-CM | POA: Diagnosis not present

## 2024-03-06 DIAGNOSIS — Z1211 Encounter for screening for malignant neoplasm of colon: Secondary | ICD-10-CM

## 2024-03-06 DIAGNOSIS — Z4689 Encounter for fitting and adjustment of other specified devices: Secondary | ICD-10-CM

## 2024-03-06 DIAGNOSIS — I10 Essential (primary) hypertension: Secondary | ICD-10-CM | POA: Diagnosis not present

## 2024-03-06 DIAGNOSIS — Z01419 Encounter for gynecological examination (general) (routine) without abnormal findings: Secondary | ICD-10-CM | POA: Diagnosis not present

## 2024-03-06 DIAGNOSIS — Z1331 Encounter for screening for depression: Secondary | ICD-10-CM | POA: Diagnosis not present

## 2024-03-06 DIAGNOSIS — N814 Uterovaginal prolapse, unspecified: Secondary | ICD-10-CM | POA: Diagnosis not present

## 2024-03-06 LAB — HEMOCCULT GUIAC POC 1CARD (OFFICE): Fecal Occult Blood, POC: NEGATIVE

## 2024-03-06 MED ORDER — LISINOPRIL 10 MG PO TABS: 10.0000 mg | ORAL_TABLET | Freq: Every day | ORAL | 6 refills | Status: DC

## 2024-03-06 NOTE — Progress Notes (Signed)
 Patient ID: Misty Wilson, female   DOB: 10/05/1941, 83 y.o.   MRN: [id number] History of Present Illness: Misty Wilson  is a 83 year old white female, divorced, PM in for a well woman gyn exam and pessary maintenance.  PCP is Dr Kermit Ped   Current Medications, Allergies, Past Medical History, Past Surgical History, Family History and Social History were reviewed in [employer] electronic medical record.     Review of Systems: Patient denies any headaches, hearing loss,  blurred vision, shortness of breath, chest pain, abdominal pain, problems with bowel movements, urination, or intercourse(not active). No joint pain or mood swings.  Denies any vaginal bleeding Has no energy   Physical Exam:BP (!) 182/71 (BP Location: Left Arm, Patient Position: Sitting, Cuff Size: Normal)   Pulse (!) 56   Ht 5\' 5"  (1.651 m)   Wt 174 lb (78.9 kg)   BMI 28.96 kg/m   General:  Well developed, well nourished, no acute distress Skin:  Warm and dry Neck:  Midline trachea, normal thyroid , good ROM, no lymphadenopathy, carotid bruits heard Lungs; Clear to auscultation bilaterally Breast:  No dominant palpable mass, retraction, or nipple discharge, has bilateral implants Cardiovascular: Regular rate and rhythm Abdomen:  Soft, non tender, no hepatosplenomegaly Pelvic:  External genitalia is normal in appearance, no lesions.has clit ring.  The vagina Gelhorn pessary removed, pale in color,  +cystocele and prolapse, pessary cleaned and reinserted. Urethra has no lesions or masses. The cervix is smooth.  Uterus is felt to be normal size, shape, and contour.  No adnexal masses or tenderness noted.Bladder is non tender, no masses felt. Rectal: Good sphincter tone, no polyps, or hemorrhoids felt.  Hemoccult negative. Extremities/musculoskeletal:  No swelling or varicosities noted, no clubbing or cyanosis Psych:  No mood changes, alert and cooperative,seems happy AA is 0 Fall risk is low    03/06/2024    9:47 AM  02/08/2024   11:02 AM 07/18/2023    9:43 AM  Depression screen PHQ 2/9  Decreased Interest 0 0 0  Down, Depressed, Hopeless 0 0 0  PHQ - 2 Score 0 0 0  Altered sleeping 0 0   Tired, decreased energy 3 0   Change in appetite 1 0   Feeling bad or failure about yourself  0 0   Trouble concentrating 0 0   Moving slowly or fidgety/restless 0 0   Suicidal thoughts 0 0   PHQ-9 Score 4 0   Difficult doing work/chores  Not difficult at all        03/06/2024    9:47 AM 02/08/2024   11:02 AM 04/04/2023   11:31 AM 08/02/2022    9:22 AM  GAD 7 : Generalized Anxiety Score  Nervous, Anxious, on Edge 0 0 0 0  Control/stop worrying 0 0 0 0  Worry too much - different things 0 0 0 0  Trouble relaxing 0 0 0 0  Restless 0 0 0 0  Easily annoyed or irritable 0 0 0 0  Afraid - awful might happen 0 0 0 0  Total GAD 7 Score 0 0 0 0  Anxiety Difficulty  Not difficult at all    Examination chaperoned by Alphonso Aschoff LPN     Impression and plan: 1. Encounter for well woman exam with routine gynecological exam (Primary) Physical with PCP Labs with PCP Stay active   2. Pessary maintenance Pessary cleaned and reinserted Return in 3 months for maintenance   3. Cystocele with uterine prolapse  4. Encounter  for screening fecal occult blood testing Hemoccult was negative - POCT occult blood stool  5. Hypertension, unspecified type Has been off meds for months Will rx lisinopril 10 mg  Meds ordered this encounter  Medications   lisinopril (ZESTRIL) 10 MG tablet    Sig: Take 1 tablet (10 mg total) by mouth daily.    Dispense:  30 tablet    Refill:  6    Supervising Provider:   Evalyn Hillier H [[id number]]    Recheck BP in 3 months  6. Screening mammogram for breast cancer Pt to call for mammogram

## 2024-03-15 ENCOUNTER — Telehealth: Payer: Self-pay

## 2024-03-15 MED ORDER — HYDROCHLOROTHIAZIDE 12.5 MG PO CAPS: 12.5000 mg | ORAL_CAPSULE | Freq: Every day | ORAL | 6 refills | Status: AC

## 2024-03-15 NOTE — Telephone Encounter (Signed)
 Rx sent in for Microzide , stop lisinopril  due to dizziness

## 2024-03-15 NOTE — Telephone Encounter (Signed)
 Pt states she can't take the Lisinopril  because it causes dizziness. The hydrochlorothiazide  lowers BP and she gets lightheaded but she can deal with that. Thanks! JSY

## 2024-03-15 NOTE — Telephone Encounter (Signed)
 Patient would like for a nurse to call her about the blood pressure medication that [facility] prescribed her.

## 2024-05-09 ENCOUNTER — Ambulatory Visit
Admission: EM | Admit: 2024-05-09 | Discharge: 2024-05-09 | Disposition: A | Discharge status: Home / Self Care | Attending: Family Medicine | Admitting: Family Medicine

## 2024-05-09 DIAGNOSIS — M5432 Sciatica, left side: Secondary | ICD-10-CM | POA: Diagnosis not present

## 2024-05-09 MED ORDER — METHYLPREDNISOLONE SODIUM SUCC 125 MG IJ SOLR
60.0000 mg | Freq: Once | INTRAMUSCULAR | Status: AC
  Administered 2024-05-09: 60 mg via INTRAMUSCULAR

## 2024-05-09 NOTE — ED Triage Notes (Signed)
 Pt states that she has some upper back leg pain.

## 2024-05-09 NOTE — Discharge Instructions (Signed)
 We have given you a steroid shot today for pain and inflammation.  You may also use heat, stretches, Epsom salt baths, muscle rubs.  Follow-up for worsening symptoms.

## 2024-05-09 NOTE — ED Provider Notes (Signed)
 [facility]    CSN: [id number] Arrival date & time: 05/09/24  1022      History   Chief Complaint Chief Complaint  Patient presents with   Leg Pain    HPI Rebeka  A Emrich is a 83 y.o. female.   Patient presenting today with several week history of left buttock and posterior upper leg pain that she believes to be coming from lots of kneeling and bending during yard work and gardening.  Denies numbness, tingling, weakness, bowel or bladder incontinence, saddle anesthesias, fevers, urinary symptoms.  States she had very similar symptoms last year around this time and had a Solu-Medrol  injection that resolved the issue.  So far trying stretches and ibuprofen with mild temporary benefit.    Past Medical History:  Diagnosis Date   Breast mass, left 04/25/2013   Multiple gastric ulcers     Patient Active Problem List   Diagnosis Date Noted   Encounter for well woman exam with routine gynecological exam 03/06/2024   Mixed hyperlipidemia 02/08/2024   Prediabetes 07/18/2023   Vulvar itching 05/02/2023   History of melena 04/18/2023   Posterior knee pain, left 04/04/2023   Encounter for screening fecal occult blood testing 08/02/2022   Routine Papanicolaou smear 08/02/2022   ASCUS of cervix with negative high risk HPV 08/02/2022   Encounter for gynecological examination with Papanicolaou smear of cervix 08/02/2022   Hypertension 08/02/2022   Screening mammogram for breast cancer 08/02/2022   Pessary maintenance 03/19/2021   Ankle swelling 03/19/2021   FH: colon cancer 07/21/2020   Cystocele with uterine prolapse 04/07/2020   Screening for colorectal cancer 04/07/2020   Encounter for fitting and adjustment of pessary 04/07/2020   Elevated cholesterol with elevated triglycerides 09/18/2018   Elevated BP without diagnosis of hypertension 09/18/2018   History of colonic polyps    Diverticulosis of colon without hemorrhage    Breast mass, left 04/25/2013   TIBIALIS  TENDINITIS 04/27/2009   FOOT PAIN, RIGHT 11/20/2008    Past Surgical History:  Procedure Laterality Date   AUGMENTATION MAMMAPLASTY     CATARACT EXTRACTION W/PHACO Right 11/23/2020   Procedure: CATARACT EXTRACTION PHACO AND INTRAOCULAR LENS PLACEMENT RIGHT EYE;  Surgeon: Harrie Agent, MD;  Location: [facility];  Service: Ophthalmology;  Laterality: Right;  right CDE=28.15   CATARACT EXTRACTION W/PHACO Left 12/14/2020   Procedure: CATARACT EXTRACTION PHACO AND INTRAOCULAR LENS PLACEMENT (IOC);  Surgeon: Harrie Agent, MD;  Location: [facility];  Service: Ophthalmology;  Laterality: Left;  CDE: 20.82   CHOLECYSTECTOMY     COLONOSCOPY N/A 04/06/2015   Procedure: COLONOSCOPY;  Surgeon: Lamar CHRISTELLA Hollingshead, MD;  [facility]: [facility];  Service: Endoscopy;  Laterality: N/A;  8:45   COLONOSCOPY N/A 09/02/2020   Procedure: COLONOSCOPY;  Surgeon: Hollingshead Lamar CHRISTELLA, MD;  [facility];  Service: Endoscopy;  Laterality: N/A;  1:00pm   EXCISION MORTON'S NEUROMA Left 12/09/2015   Procedure: EXCISION NEUROMA LEFT FOOT;  Surgeon: Morene Anon, DPM;  Location: [facility];  Service: Podiatry;  Laterality: Left;   POLYPECTOMY  09/02/2020   Procedure: POLYPECTOMY;  Surgeon: Hollingshead Lamar CHRISTELLA, MD;  [facility];  Service: Endoscopy;;   TUBAL LIGATION     veins     had varicose veins done    OB History     Gravida  4   Para  4   Term      Preterm      AB      Living  SAB      IAB      Ectopic      Multiple      Live Births               Home Medications    Prior to Admission medications   Medication Sig Start Date End Date Taking? Authorizing Provider  Ascorbic Acid (VITAMIN C) 1000 MG tablet Take 2,000 mg by mouth daily.   Yes [provider]  aspirin EC 81 MG tablet Take 81 mg by mouth every evening. Swallow whole.   Yes [provider]  benzonatate  (TESSALON ) 100 MG capsule Take 1 capsule (100 mg total) by mouth 3 (three) times daily as needed  for cough. 10/09/23  Yes Chandra Harlene LABOR, NP  Calcium-Magnesium-Vitamin D  (CALCIUM 1200+D3 PO) Take 1 tablet by mouth daily.   Yes [provider]  cholecalciferol (VITAMIN D ) 25 MCG (1000 UNIT) tablet Take 1,000 Units by mouth daily.   Yes [provider]  clotrimazole -betamethasone  (LOTRISONE ) cream Apply thin layer to external tissue bid prn 05/02/23  Yes Signa Delon LABOR, NP  fluticasone  (FLONASE ) 50 MCG/ACT nasal spray Place 1 spray into both nostrils daily. 10/09/23  Yes Chandra Harlene LABOR, NP  Ginkgo Biloba 120 MG CAPS Take 120 mg by mouth daily.   Yes [provider]  guaiFENesin  (ROBITUSSIN) 100 MG/5ML liquid Take 10 mLs by mouth every 8 (eight) hours as needed for cough or to loosen phlegm. 06/26/23  Yes Leath-Warren, Etta PARAS, NP  hydrochlorothiazide  (MICROZIDE ) 12.5 MG capsule Take 1 capsule (12.5 mg total) by mouth daily. 03/15/24  Yes Signa Delon A, NP  ipratropium (ATROVENT ) 0.03 % nasal spray Place 2 sprays into both nostrils every 12 (twelve) hours. 06/26/23  Yes Leath-Warren, Etta PARAS, NP  Krill Oil 350 MG CAPS Take 350 mg by mouth daily.   Yes [provider]  triamcinolone  cream (KENALOG ) 0.1 % Apply 1 Application topically 2 (two) times daily. 09/26/23  Yes Stuart Vernell Norris, PA-C  Turmeric 500 MG CAPS Take 5,000 mg by mouth daily.   Yes [provider]  vitamin B-12 (CYANOCOBALAMIN) 1000 MCG tablet Take 1,000 mcg by mouth daily.   Yes [provider]    Family History Family History  Problem Relation Age of Onset   Cancer Mother        colon   Other Mother        aneursym   Cancer Brother        colon   Cancer Sister        bone,breast   Suicidality Daughter     Social History Social History   Tobacco Use   Smoking status: Light Smoker    Current packs/day: 0.10    Average packs/day: 0.1 packs/day for 12.0 years (1.2 ttl pk-yrs)    Types: Cigarettes   Smokeless tobacco: Never   Tobacco  comments:    smokes 1 pack per week  Vaping Use   Vaping status: Never Used  Substance Use Topics   Alcohol use: No   Drug use: No     Allergies   Codeine, Other, and Prednisone    Review of Systems Review of Systems Per HPI  Physical Exam Triage Vital Signs ED Triage Vitals  Encounter Vitals Group     BP 05/09/24 1050 (!) 142/88     Girls Systolic BP Percentile --      Girls Diastolic BP Percentile --      Boys Systolic BP Percentile --  Boys Diastolic BP Percentile --      Pulse Rate 05/09/24 1050 (!) 59     Resp 05/09/24 1050 17     Temp 05/09/24 1050 98.3 F (36.8 C)     Temp Source 05/09/24 1050 Oral     SpO2 05/09/24 1050 92 %     Weight 05/09/24 1049 169 lb (76.7 kg)     Height 05/09/24 1049 5' 5 (1.651 m)     Head Circumference --      Peak Flow --      Pain Score 05/09/24 1049 5     Pain Loc --      Pain Education --      Exclude from Growth Chart --    No data found.  Updated Vital Signs BP (!) 142/88 (BP Location: Right Arm)   Pulse (!) 59   Temp 98.3 F (36.8 C) (Oral)   Resp 17   Ht 5' 5 (1.651 m)   Wt 169 lb (76.7 kg)   SpO2 92%   BMI 28.12 kg/m   Visual Acuity Right Eye Distance:   Left Eye Distance:   Bilateral Distance:    Right Eye Near:   Left Eye Near:    Bilateral Near:     Physical Exam Vitals and nursing note reviewed.  Constitutional:      Appearance: Normal appearance. She is not ill-appearing.  HENT:     Head: Atraumatic.  Eyes:     Extraocular Movements: Extraocular movements intact.     Conjunctiva/sclera: Conjunctivae normal.  Cardiovascular:     Rate and Rhythm: Normal rate and regular rhythm.     Heart sounds: Normal heart sounds.  Pulmonary:     Effort: Pulmonary effort is normal.     Breath sounds: Normal breath sounds.  Musculoskeletal:        General: Tenderness present. No swelling or deformity. Normal range of motion.     Cervical back: Normal range of motion and neck supple.     Comments: No  midline spinal tenderness to palpation diffusely.  Negative straight leg raise bilateral lower extremities.  Normal gait and range of motion.  Mild tenderness to palpation to the left posterior buttock  Skin:    General: Skin is warm and dry.  Neurological:     Mental Status: She is alert and oriented to person, place, and time.     Motor: No weakness.     Gait: Gait normal.     Comments: Left lower extremity neurovascularly intact  Psychiatric:        Mood and Affect: Mood normal.        Thought Content: Thought content normal.        Judgment: Judgment normal.      UC Treatments / Results  Labs (all labs ordered are listed, but only abnormal results are displayed) Labs Reviewed - No data to display  EKG   Radiology No results found.  Procedures Procedures (including critical care time)  Medications Ordered in UC Medications  methylPREDNISolone  sodium succinate (SOLU-MEDROL ) 125 mg/2 mL injection 60 mg (60 mg Intramuscular Given 05/09/24 1127)    Initial Impression / Assessment and Plan / UC Course  I have reviewed the triage vital signs and the nursing notes.  Pertinent labs & imaging results that were available during my care of the patient were reviewed by me and considered in my medical decision making (see chart for details).     Per chart review she received 60 mg  of IM Solu-Medrol  last year for a similar incident and she is requesting the same as that was very effective and well-tolerated.  IM Solu-Medrol  given, discussed supportive over-the-counter medications and home care as well as return precautions.  No red flag findings today.  Final Clinical Impressions(s) / UC Diagnoses   Final diagnoses:  Left sided sciatica     Discharge Instructions      We have given you a steroid shot today for pain and inflammation.  You may also use heat, stretches, Epsom salt baths, muscle rubs.  Follow-up for worsening symptoms.    ED Prescriptions   None    PDMP  not reviewed this encounter.   Stuart Vernell Norris, [place] 05/09/24 1146

## 2024-06-06 ENCOUNTER — Encounter: Payer: Self-pay | Admitting: Adult Health

## 2024-06-06 ENCOUNTER — Ambulatory Visit: Admitting: Adult Health

## 2024-06-06 VITALS — BP 149/78 | HR 62 | Ht 65.0 in | Wt 173.0 lb

## 2024-06-06 DIAGNOSIS — I10 Essential (primary) hypertension: Secondary | ICD-10-CM

## 2024-06-06 DIAGNOSIS — N814 Uterovaginal prolapse, unspecified: Secondary | ICD-10-CM

## 2024-06-06 DIAGNOSIS — Z4689 Encounter for fitting and adjustment of other specified devices: Secondary | ICD-10-CM | POA: Diagnosis not present

## 2024-06-06 NOTE — Progress Notes (Signed)
  Subjective:     Patient ID: Misty Wilson, female   DOB: Oct 21, 1941, 83 y.o.   MRN: [id number]  HPI Misty Wilson  is a 83 year old white female, divorced, PM in for pessary maintenance and BP check.  Review of Systems Denies any bleeding or discharge Doing good   BP (!) 149/78 (BP Location: Left Arm, Patient Position: Sitting, Cuff Size: Normal)   Pulse 62   Ht 5' 5 (1.651 m)   Wt 173 lb (78.5 kg)   BMI 28.79 kg/m   Objective:   Physical Exam BP (!) 149/78 (BP Location: Left Arm, Patient Position: Sitting, Cuff Size: Normal)   Pulse 62   Ht 5' 5 (1.651 m)   Wt 173 lb (78.5 kg)   BMI 28.79 kg/m     Skin warm and dry. Lungs: clear to ausculation bilaterally. Cardiovascular: regular rate and rhythm.  Pelvic:  External genitalia is normal in appearance, no lesions.has clit ring.  The vagina Gelhorn pessary removed, pale in color,  +cystocele and prolapse, no lesions, pessary cleaned and reinserted.  Upstream - 06/06/24 9082       Pregnancy Intention Screening   Does the patient want to become pregnant in the next year? N/A    Does the patient's partner want to become pregnant in the next year? N/A    Would the patient like to discuss contraceptive options today? N/A      Contraception Wrap Up   Current Method Female Sterilization   PM   End Method Female Sterilization   PM   Contraception Counseling Provided No          Examination chaperoned by Clarita Salt LPN Assessment:     1. Pessary maintenance (Primary) Cleaned and reinserted   2. Cystocele with uterine prolapse   3. Hypertension, unspecified type BP is better continue Microzide  12.5 mg 1 daily has refills     Will recheck in 3 months  Plan:     Follow up in 3 months for pessary maintenance with me

## 2024-06-18 DIAGNOSIS — L570 Actinic keratosis: Secondary | ICD-10-CM | POA: Diagnosis not present

## 2024-08-08 ENCOUNTER — Ambulatory Visit

## 2024-08-21 DIAGNOSIS — Z0000 Encounter for general adult medical examination without abnormal findings: Secondary | ICD-10-CM | POA: Diagnosis not present

## 2024-08-21 DIAGNOSIS — Z79899 Other long term (current) drug therapy: Secondary | ICD-10-CM | POA: Diagnosis not present

## 2024-08-21 DIAGNOSIS — E559 Vitamin D deficiency, unspecified: Secondary | ICD-10-CM | POA: Diagnosis not present

## 2024-08-21 DIAGNOSIS — E782 Mixed hyperlipidemia: Secondary | ICD-10-CM | POA: Diagnosis not present

## 2024-08-21 DIAGNOSIS — R7303 Prediabetes: Secondary | ICD-10-CM | POA: Diagnosis not present

## 2024-08-21 DIAGNOSIS — M8589 Other specified disorders of bone density and structure, multiple sites: Secondary | ICD-10-CM | POA: Diagnosis not present

## 2024-08-22 ENCOUNTER — Other Ambulatory Visit (HOSPITAL_COMMUNITY): Payer: Self-pay | Admitting: Adult Health Nurse Practitioner

## 2024-08-22 DIAGNOSIS — M8589 Other specified disorders of bone density and structure, multiple sites: Secondary | ICD-10-CM

## 2024-09-03 ENCOUNTER — Other Ambulatory Visit (HOSPITAL_COMMUNITY)

## 2024-09-05 ENCOUNTER — Ambulatory Visit (HOSPITAL_COMMUNITY)
Admission: RE | Admit: 2024-09-05 | Discharge: 2024-09-05 | Disposition: A | Discharge status: Home / Self Care | Source: Ambulatory Visit | Attending: Adult Health Nurse Practitioner | Admitting: Adult Health Nurse Practitioner

## 2024-09-05 DIAGNOSIS — M8589 Other specified disorders of bone density and structure, multiple sites: Secondary | ICD-10-CM | POA: Insufficient documentation

## 2024-09-05 DIAGNOSIS — Z780 Asymptomatic menopausal state: Secondary | ICD-10-CM | POA: Diagnosis not present

## 2024-09-06 ENCOUNTER — Encounter: Payer: Self-pay | Admitting: Adult Health

## 2024-09-06 ENCOUNTER — Ambulatory Visit: Admitting: Adult Health

## 2024-09-06 VITALS — BP 150/77 | HR 65 | Ht 65.0 in | Wt 165.0 lb

## 2024-09-06 DIAGNOSIS — I10 Essential (primary) hypertension: Secondary | ICD-10-CM | POA: Diagnosis not present

## 2024-09-06 DIAGNOSIS — Z1331 Encounter for screening for depression: Secondary | ICD-10-CM | POA: Diagnosis not present

## 2024-09-06 DIAGNOSIS — Z4689 Encounter for fitting and adjustment of other specified devices: Secondary | ICD-10-CM

## 2024-09-06 DIAGNOSIS — N898 Other specified noninflammatory disorders of vagina: Secondary | ICD-10-CM | POA: Diagnosis not present

## 2024-09-06 DIAGNOSIS — N814 Uterovaginal prolapse, unspecified: Secondary | ICD-10-CM | POA: Diagnosis not present

## 2024-09-06 MED ORDER — METRONIDAZOLE 0.75 % VA GEL: 1.0000 | Freq: Every day | VAGINAL | 0 refills | Status: AC

## 2024-09-06 NOTE — Progress Notes (Signed)
  Subjective:     Patient ID: Misty Wilson, female   DOB: 08-21-1941, 83 y.o.   MRN: [id number]  HPI Breyon  is a 83 year old white female, divorced, PM in for pessary maintenance, has noticed vaginal discharge with odor.  PCP is C Keatts NP  Review of Systems For pessary maintenance  Has noticed vaginal discharge with odor, no bleeding Reviewed past medical,surgical, social and family history. Reviewed medications and allergies.     Objective:   Physical Exam BP (!) 150/77 (BP Location: Left Arm, Patient Position: Sitting, Cuff Size: Normal)   Pulse 65   Ht 5' 5 (1.651 m)   Wt 165 lb (74.8 kg)   BMI 27.46 kg/m  has had 6 cups of coffee today. She has lost 12 lbs and going to the gym Skin warm and dry.Pelvic: external genitalia is normal in appearance no lesions, has clit ring,vagina: Gelhorn pessary removed, has creamy discharge with odor, no blood, +cystocele and prolapse, cleaned and reinserted.   Examination chaperoned by Clarita Salt LPN  [facility] - 09/06/24 0858       Pregnancy Intention Screening   Does the patient want to become pregnant in the next year? N/A    Does the patient's partner want to become pregnant in the next year? N/A    Would the patient like to discuss contraceptive options today? N/A      Contraception Wrap Up   Current Method Abstinence;Female Sterilization;Post-Menopause    End Method Abstinence;Female Sterilization;Post-Menopause    Contraception Counseling Provided No          Assessment:     1. Pessary maintenance (Primary) Cleaned an reinserted   2. Cystocele with uterine prolapse   3. Vaginal discharge Creamy discharge, rx Metrogel  4. Vaginal odor +odor, will rx Metrogel  Meds ordered this encounter  Medications   metroNIDAZOLE (METROGEL) 0.75 % vaginal gel    Sig: Place 1 Applicatorful vaginally at bedtime.    Dispense:  70 g    Refill:  0    Supervising Provider:   JAYNE, LUTHER H [[id number]]     5. Hypertension,  unspecified type Take BP meds,microzide  12.5 mg 1 daily  and follow up with PCP     Plan:     Follow up in 3 months for pessary maitenance

## 2024-09-11 ENCOUNTER — Ambulatory Visit: Admitting: Physician Assistant

## 2024-10-25 ENCOUNTER — Ambulatory Visit
Admission: EM | Admit: 2024-10-25 | Discharge: 2024-10-25 | Disposition: A | Discharge status: Home / Self Care | Attending: Family Medicine | Admitting: Family Medicine

## 2024-10-25 DIAGNOSIS — Z23 Encounter for immunization: Secondary | ICD-10-CM

## 2024-10-25 DIAGNOSIS — S61209A Unspecified open wound of unspecified finger without damage to nail, initial encounter: Secondary | ICD-10-CM

## 2024-10-25 MED ORDER — MUPIROCIN 2 % EX OINT: 1.0000 | TOPICAL_OINTMENT | Freq: Two times a day (BID) | CUTANEOUS | 0 refills | Status: AC

## 2024-10-25 MED ORDER — BACITRACIN 500 UNIT/GM EX OINT
1.0000 | TOPICAL_OINTMENT | Freq: Two times a day (BID) | CUTANEOUS | Status: DC
  Administered 2024-10-25: 1 via TOPICAL

## 2024-10-25 MED ORDER — TETANUS-DIPHTH-ACELL PERTUSSIS 5-2-15.5 LF-MCG/0.5 IM SUSP
0.5000 mL | Freq: Once | INTRAMUSCULAR | Status: AC
  Administered 2024-10-25: 0.5 mL via INTRAMUSCULAR

## 2024-10-25 MED ORDER — CHLORHEXIDINE GLUCONATE 4 % EX SOLN: Freq: Every day | CUTANEOUS | 0 refills | Status: AC | PRN

## 2024-10-25 MED ORDER — CEPHALEXIN 500 MG PO CAPS: 500.0000 mg | ORAL_CAPSULE | Freq: Two times a day (BID) | ORAL | 0 refills | Status: AC

## 2024-10-25 NOTE — Discharge Instructions (Signed)
 Take the course of antibiotics, clean the area at least once a day with the Hibiclens  solution and apply the mupirocin  ointment and a nonstick dressing.  Keep the area clean and dressed at all times until fully healed.  We have updated your tetanus shot today.  Follow-up for significantly worsening symptoms at any time.

## 2024-10-25 NOTE — ED Provider Notes (Signed)
 " [facility]    CSN: [id number] Arrival date & time: 10/25/24  0813      History   Chief Complaint No chief complaint on file.   HPI Misty Wilson is a 83 y.o. female.   Patient presenting today with 1 day history of an avulsion injury to the right distal fifth fingertip from using a potato slicer.  Has cleaned the area and applied a dressing to stop the bleeding.  Denies loss of range of motion, nail injury, weakness, numbness, uncontrolled bleeding.  Not currently on any blood thinners.  Does not recall her last Tdap but thinks it was many years ago.    Past Medical History:  Diagnosis Date   Breast mass, left 04/25/2013   Multiple gastric ulcers     Patient Active Problem List   Diagnosis Date Noted   Vaginal odor 09/06/2024   Vaginal discharge 09/06/2024   Encounter for well woman exam with routine gynecological exam 03/06/2024   Mixed hyperlipidemia 02/08/2024   Prediabetes 07/18/2023   Vulvar itching 05/02/2023   History of melena 04/18/2023   Posterior knee pain, left 04/04/2023   Encounter for screening fecal occult blood testing 08/02/2022   Routine Papanicolaou smear 08/02/2022   ASCUS of cervix with negative high risk HPV 08/02/2022   Encounter for gynecological examination with Papanicolaou smear of cervix 08/02/2022   Hypertension 08/02/2022   Screening mammogram for breast cancer 08/02/2022   Pessary maintenance 03/19/2021   Ankle swelling 03/19/2021   FH: colon cancer 07/21/2020   Cystocele with uterine prolapse 04/07/2020   Screening for colorectal cancer 04/07/2020   Encounter for fitting and adjustment of pessary 04/07/2020   Elevated cholesterol with elevated triglycerides 09/18/2018   Elevated BP without diagnosis of hypertension 09/18/2018   History of colonic polyps    Diverticulosis of colon without hemorrhage    Breast mass, left 04/25/2013   TIBIALIS TENDINITIS 04/27/2009   FOOT PAIN, RIGHT 11/20/2008    Past Surgical  History:  Procedure Laterality Date   AUGMENTATION MAMMAPLASTY     CATARACT EXTRACTION W/PHACO Right 11/23/2020   Procedure: CATARACT EXTRACTION PHACO AND INTRAOCULAR LENS PLACEMENT RIGHT EYE;  Surgeon: Harrie Agent, MD;  Location: [facility];  Service: Ophthalmology;  Laterality: Right;  right CDE=28.15   CATARACT EXTRACTION W/PHACO Left 12/14/2020   Procedure: CATARACT EXTRACTION PHACO AND INTRAOCULAR LENS PLACEMENT (IOC);  Surgeon: Harrie Agent, MD;  Location: [facility];  Service: Ophthalmology;  Laterality: Left;  CDE: 20.82   CHOLECYSTECTOMY     COLONOSCOPY N/A 04/06/2015   Procedure: COLONOSCOPY;  Surgeon: Lamar CHRISTELLA Hollingshead, MD;  Location: [facility];  Service: Endoscopy;  Laterality: N/A;  8:45   COLONOSCOPY N/A 09/02/2020   Procedure: COLONOSCOPY;  Surgeon: Hollingshead Lamar CHRISTELLA, MD;  Location: [facility];  Service: Endoscopy;  Laterality: N/A;  1:00pm   EXCISION MORTON'S NEUROMA Left 12/09/2015   Procedure: EXCISION NEUROMA LEFT FOOT;  Surgeon: Morene Anon, DPM;  Location: [facility];  Service: Podiatry;  Laterality: Left;   POLYPECTOMY  09/02/2020   Procedure: POLYPECTOMY;  Surgeon: Hollingshead Lamar CHRISTELLA, MD;  Location: [facility];  Service: Endoscopy;;   TUBAL LIGATION     veins     had varicose veins done    OB History     Gravida  4   Para  4   Term      Preterm      AB      Living  SAB      IAB      Ectopic      Multiple      Live Births               Home Medications    Prior to Admission medications  Medication Sig Start Date End Date Taking? Authorizing Provider  cephALEXin  (KEFLEX ) 500 MG capsule Take 1 capsule (500 mg total) by mouth 2 (two) times daily. 10/25/24  Yes Stuart Vernell Norris, PA-C  chlorhexidine  (HIBICLENS ) 4 % external liquid Apply topically daily as needed. 10/25/24  Yes Stuart Vernell Norris, PA-C  mupirocin  ointment (BACTROBAN ) 2 % Apply 1 Application topically 2 (two) times daily. 10/25/24  Yes Stuart Vernell Norris, PA-C   Ascorbic Acid (VITAMIN C) 1000 MG tablet Take 2,000 mg by mouth daily.    [provider]  aspirin EC 81 MG tablet Take 81 mg by mouth every evening. Swallow whole.    [provider]  CALCIUM MAGNESIUM ZINC PO Take by mouth.    [provider]  Calcium-Magnesium-Vitamin D  (CALCIUM 1200+D3 PO) Take 1 tablet by mouth daily.    [provider]  CINNAMON PO Take by mouth.    [provider]  Ginkgo Biloba 120 MG CAPS Take 120 mg by mouth daily.    [provider]  hydrochlorothiazide  (MICROZIDE ) 12.5 MG capsule Take 1 capsule (12.5 mg total) by mouth daily. Patient taking differently: Take 12.5 mg by mouth as needed. 03/15/24   Signa Delon LABOR, NP  Krill Oil 350 MG CAPS Take 350 mg by mouth daily.    [provider]  metroNIDAZOLE  (METROGEL ) 0.75 % vaginal gel Place 1 Applicatorful vaginally at bedtime. 09/06/24   Signa Delon LABOR, NP  Turmeric 500 MG CAPS Take 5,000 mg by mouth daily.    [provider]  vitamin B-12 (CYANOCOBALAMIN) 1000 MCG tablet Take 1,000 mcg by mouth daily.    [provider]    Family History Family History  Problem Relation Age of Onset   Cancer Mother        colon   Other Mother        aneursym   Cancer Brother        colon   Cancer Sister        bone,breast   Suicidality Daughter     Social History Social History[1]   Allergies   Codeine, Other, and Prednisone    Review of Systems Review of Systems PER HPI  Physical Exam Triage Vital Signs ED Triage Vitals  Encounter Vitals Group     BP 10/25/24 0822 124/77     Girls Systolic BP Percentile --      Girls Diastolic BP Percentile --      Boys Systolic BP Percentile --      Boys Diastolic BP Percentile --      Pulse Rate 10/25/24 0822 73     Resp 10/25/24 0822 18     Temp 10/25/24 0822 (!) 97.5 F (36.4 C)     Temp Source 10/25/24 0822 Oral     SpO2 10/25/24 0822 96 %     Weight --      Height --       Head Circumference --      Peak Flow --      Pain Score 10/25/24 0824 2     Pain Loc --      Pain Education --      Exclude from Growth Chart --  No data found.  Updated Vital Signs BP 124/77 (BP Location: Right Arm)   Pulse 73   Temp (!) 97.5 F (36.4 C) (Oral)   Resp 18   SpO2 96%   Visual Acuity Right Eye Distance:   Left Eye Distance:   Bilateral Distance:    Right Eye Near:   Left Eye Near:    Bilateral Near:     Physical Exam Vitals and nursing note reviewed.  Constitutional:      Appearance: Normal appearance. She is not ill-appearing.  HENT:     Head: Atraumatic.  Eyes:     Extraocular Movements: Extraocular movements intact.     Conjunctiva/sclera: Conjunctivae normal.  Cardiovascular:     Rate and Rhythm: Normal rate.  Pulmonary:     Effort: Pulmonary effort is normal.  Musculoskeletal:        General: Tenderness and signs of injury present. No swelling. Normal range of motion.     Cervical back: Normal range of motion and neck supple.  Skin:    General: Skin is warm.     Comments: 1.5 cm diameter area of skin avulsion to the distal right fifth finger tip.  Bleeding well-controlled, no foreign body appreciable.  No nail injury  Neurological:     Mental Status: She is alert and oriented to person, place, and time.     Comments: Right upper extremity neurovascularly intact  Psychiatric:        Mood and Affect: Mood normal.        Thought Content: Thought content normal.        Judgment: Judgment normal.    UC Treatments / Results  Labs (all labs ordered are listed, but only abnormal results are displayed) Labs Reviewed - No data to display  EKG   Radiology No results found.  Procedures Procedures (including critical care time)  Medications Ordered in UC Medications  bacitracin  ointment 1 Application (1 Application Topical Given 10/25/24 0914)  Tdap (ADACEL) injection 0.5 mL (0.5 mLs Intramuscular Given 10/25/24 0905)    Initial  Impression / Assessment and Plan / UC Course  I have reviewed the triage vital signs and the nursing notes.  Pertinent labs & imaging results that were available during my care of the patient were reviewed by me and considered in my medical decision making (see chart for details).     Vitals and exam overall reassuring today, consistent with skin avulsion.  Will heal via secondary intention with good home wound care and Keflex  sent to prevent infection.  Hibiclens , mupirocin , nonstick dressings daily until fully healed.  Tdap updated.  Return for worsening or unresolving symptoms.  Final Clinical Impressions(s) / UC Diagnoses   Final diagnoses:  Avulsion of skin of finger, initial encounter     Discharge Instructions      Take the course of antibiotics, clean the area at least once a day with the Hibiclens  solution and apply the mupirocin  ointment and a nonstick dressing.  Keep the area clean and dressed at all times until fully healed.  We have updated your tetanus shot today.  Follow-up for significantly worsening symptoms at any time.    ED Prescriptions     Medication Sig Dispense Auth. Provider   cephALEXin  (KEFLEX ) 500 MG capsule Take 1 capsule (500 mg total) by mouth 2 (two) times daily. 14 capsule Stuart Vernell Norris, PA-C   mupirocin  ointment (BACTROBAN ) 2 % Apply 1 Application topically 2 (two) times daily. 60 g Stuart Vernell Norris, PA-C  chlorhexidine  (HIBICLENS ) 4 % external liquid Apply topically daily as needed. 236 mL Stuart Vernell Norris, [place]      PDMP not reviewed this encounter.    [1]  Social History Tobacco Use   Smoking status: Light Smoker    Current packs/day: 0.10    Average packs/day: 0.1 packs/day for 12.0 years (1.2 ttl pk-yrs)    Types: Cigarettes   Smokeless tobacco: Never   Tobacco comments:    smokes 1 pack per week  Vaping Use   Vaping status: Never Used  Substance Use Topics   Alcohol use: No   Drug use: No     Stuart Vernell Norris, PA-C 10/25/24 1101  "

## 2024-10-25 NOTE — ED Triage Notes (Signed)
 Pt reports she has cut her right 5th finger x 1 day   Pt not on blood thinners

## 2024-12-06 ENCOUNTER — Ambulatory Visit: Admitting: Adult Health
